# Patient Record
Sex: Female | Born: 1945 | Race: White | Hispanic: No | Smoking: Never smoker
Health system: Southern US, Community
[De-identification: ages and names within clinical notes are randomized; demographics above are authoritative.]

## PROBLEM LIST (undated history)

## (undated) DIAGNOSIS — G43909 Migraine, unspecified, not intractable, without status migrainosus: Secondary | ICD-10-CM

## (undated) DIAGNOSIS — N3946 Mixed incontinence: Secondary | ICD-10-CM

## (undated) DIAGNOSIS — M1990 Unspecified osteoarthritis, unspecified site: Secondary | ICD-10-CM

## (undated) DIAGNOSIS — E785 Hyperlipidemia, unspecified: Secondary | ICD-10-CM

## (undated) DIAGNOSIS — F259 Schizoaffective disorder, unspecified: Secondary | ICD-10-CM

## (undated) DIAGNOSIS — E079 Disorder of thyroid, unspecified: Secondary | ICD-10-CM

## (undated) DIAGNOSIS — F32A Depression, unspecified: Secondary | ICD-10-CM

## (undated) DIAGNOSIS — F419 Anxiety disorder, unspecified: Secondary | ICD-10-CM

## (undated) DIAGNOSIS — F329 Major depressive disorder, single episode, unspecified: Secondary | ICD-10-CM

## (undated) DIAGNOSIS — K219 Gastro-esophageal reflux disease without esophagitis: Secondary | ICD-10-CM

## (undated) DIAGNOSIS — C801 Malignant (primary) neoplasm, unspecified: Secondary | ICD-10-CM

## (undated) DIAGNOSIS — E119 Type 2 diabetes mellitus without complications: Secondary | ICD-10-CM

## (undated) DIAGNOSIS — G4733 Obstructive sleep apnea (adult) (pediatric): Secondary | ICD-10-CM

## (undated) DIAGNOSIS — R011 Cardiac murmur, unspecified: Secondary | ICD-10-CM

## (undated) DIAGNOSIS — F250 Schizoaffective disorder, bipolar type: Secondary | ICD-10-CM

## (undated) DIAGNOSIS — I519 Heart disease, unspecified: Secondary | ICD-10-CM

## (undated) DIAGNOSIS — I340 Nonrheumatic mitral (valve) insufficiency: Secondary | ICD-10-CM

## (undated) HISTORY — DX: Depression, unspecified: F32.A

## (undated) HISTORY — PX: BREAST CYST ASPIRATION: SHX578

## (undated) HISTORY — DX: Disorder of thyroid, unspecified: E07.9

## (undated) HISTORY — DX: Major depressive disorder, single episode, unspecified: F32.9

## (undated) HISTORY — DX: Heart disease, unspecified: I51.9

## (undated) HISTORY — DX: Mixed incontinence: N39.46

## (undated) HISTORY — PX: OTHER SURGICAL HISTORY: SHX169

## (undated) HISTORY — PX: CARPAL TUNNEL RELEASE: SHX101

## (undated) HISTORY — DX: Cardiac murmur, unspecified: R01.1

## (undated) HISTORY — DX: Nonrheumatic mitral (valve) insufficiency: I34.0

## (undated) HISTORY — DX: Obstructive sleep apnea (adult) (pediatric): G47.33

## (undated) HISTORY — DX: Gastro-esophageal reflux disease without esophagitis: K21.9

## (undated) HISTORY — DX: Hyperlipidemia, unspecified: E78.5

## (undated) HISTORY — DX: Unspecified osteoarthritis, unspecified site: M19.90

---

## 1998-07-30 HISTORY — PX: THYROIDECTOMY: SHX17

## 2009-01-06 ENCOUNTER — Ambulatory Visit: Payer: Self-pay | Admitting: Internal Medicine

## 2009-09-27 ENCOUNTER — Ambulatory Visit: Payer: Self-pay | Admitting: Family Medicine

## 2009-10-15 ENCOUNTER — Ambulatory Visit: Payer: Self-pay

## 2009-10-19 ENCOUNTER — Encounter: Admission: RE | Admit: 2009-10-19 | Discharge: 2009-10-19 | Payer: Self-pay | Admitting: Unknown Physician Specialty

## 2011-08-21 IMAGING — CR SACRUM AND COCCYX - 2+ VIEW
1 series · 4 of 4 positions shown · non-contrast
Comparison: none

REASON FOR EXAM: coccygel pain
COMMENTS:

[Series 1: view not recorded · 0.17mm/px · 4 of 4 slices shown]
[im 1/4]
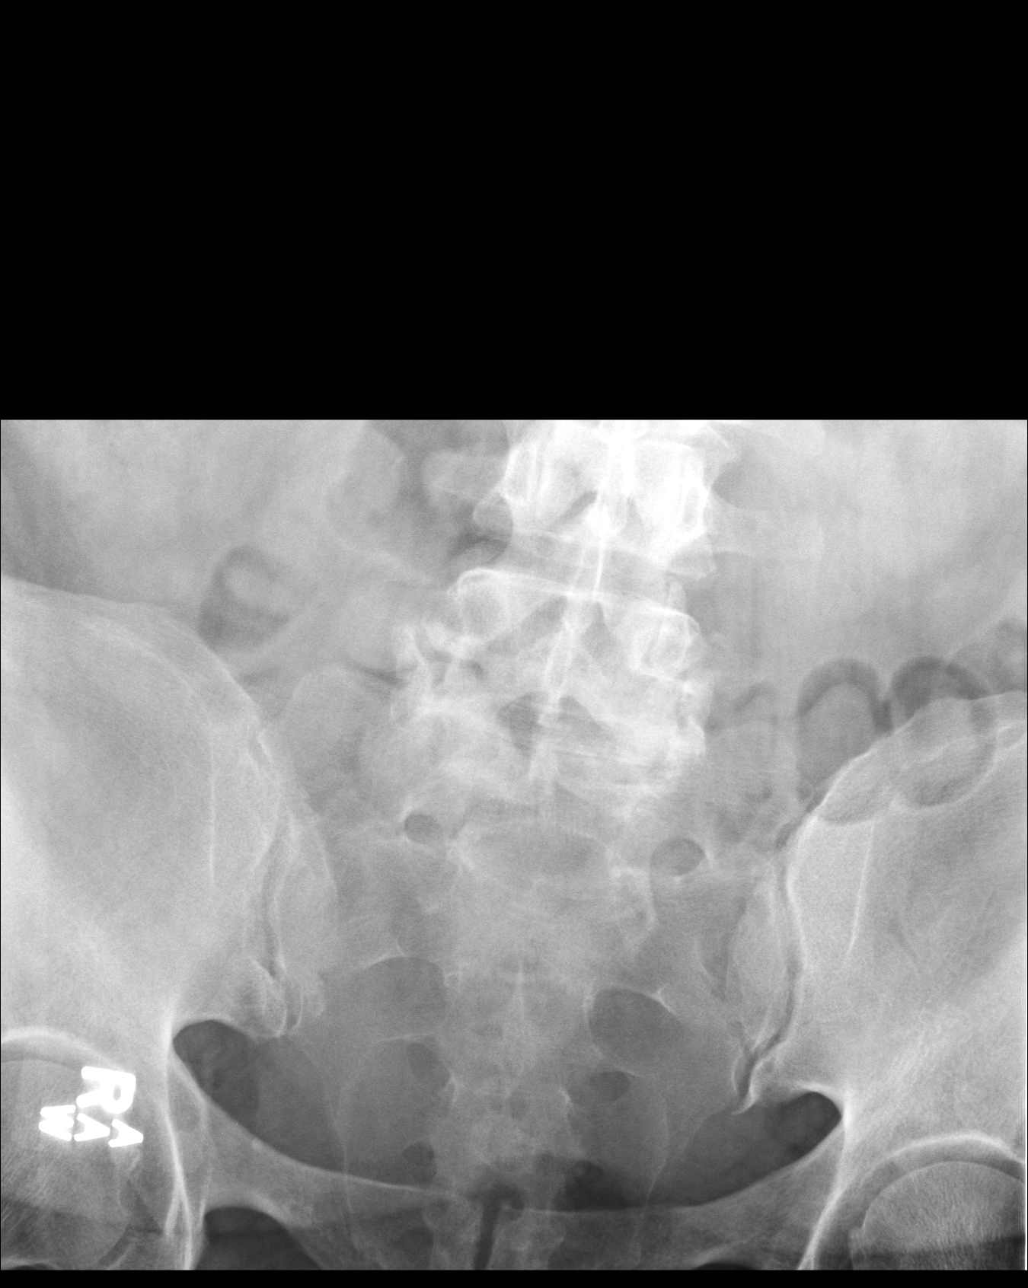
[im 2/4]
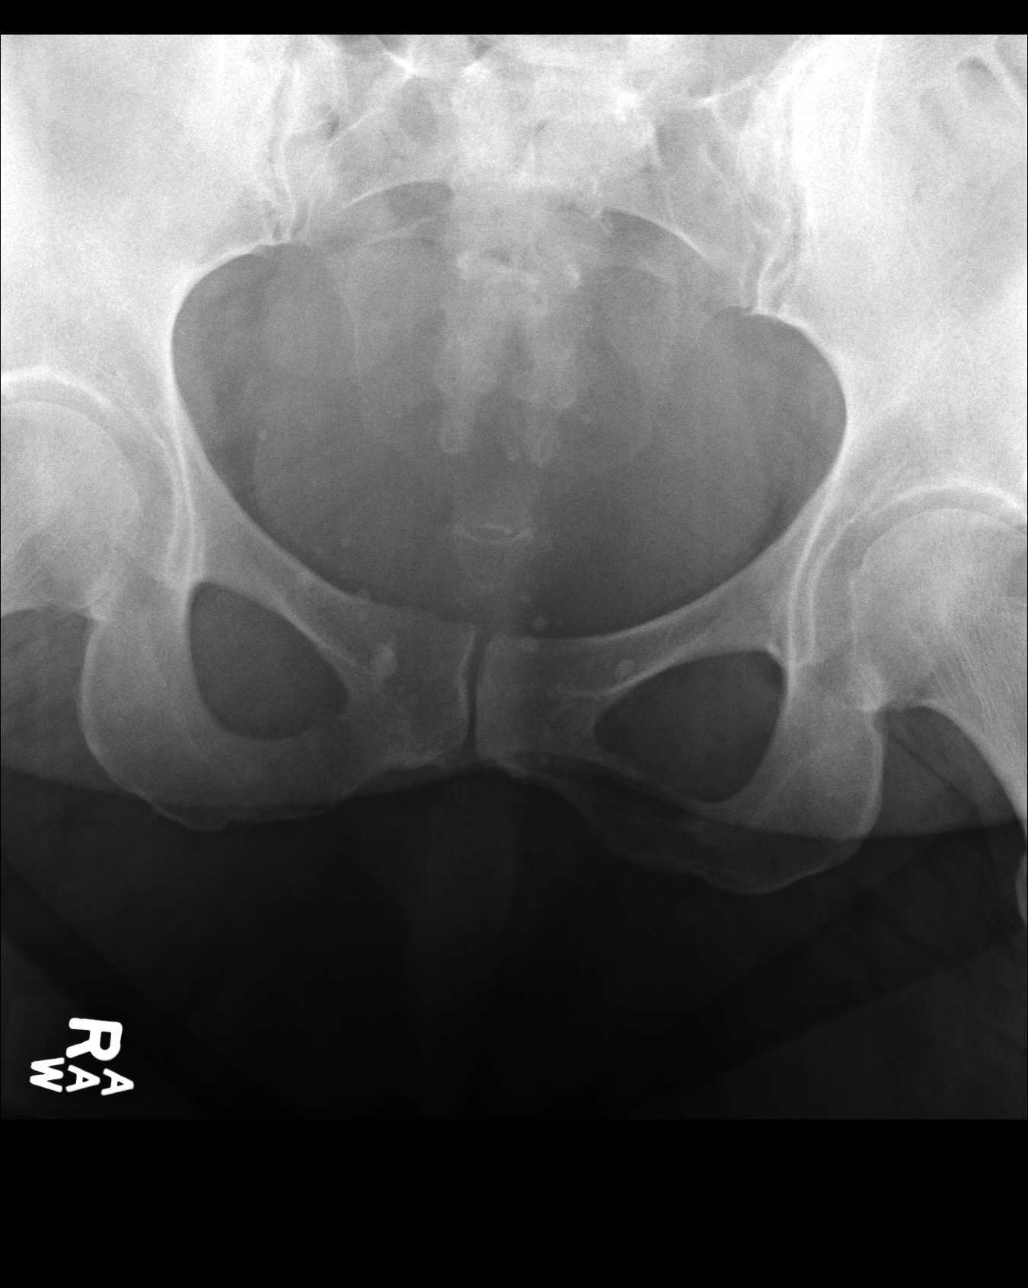
[im 3/4]
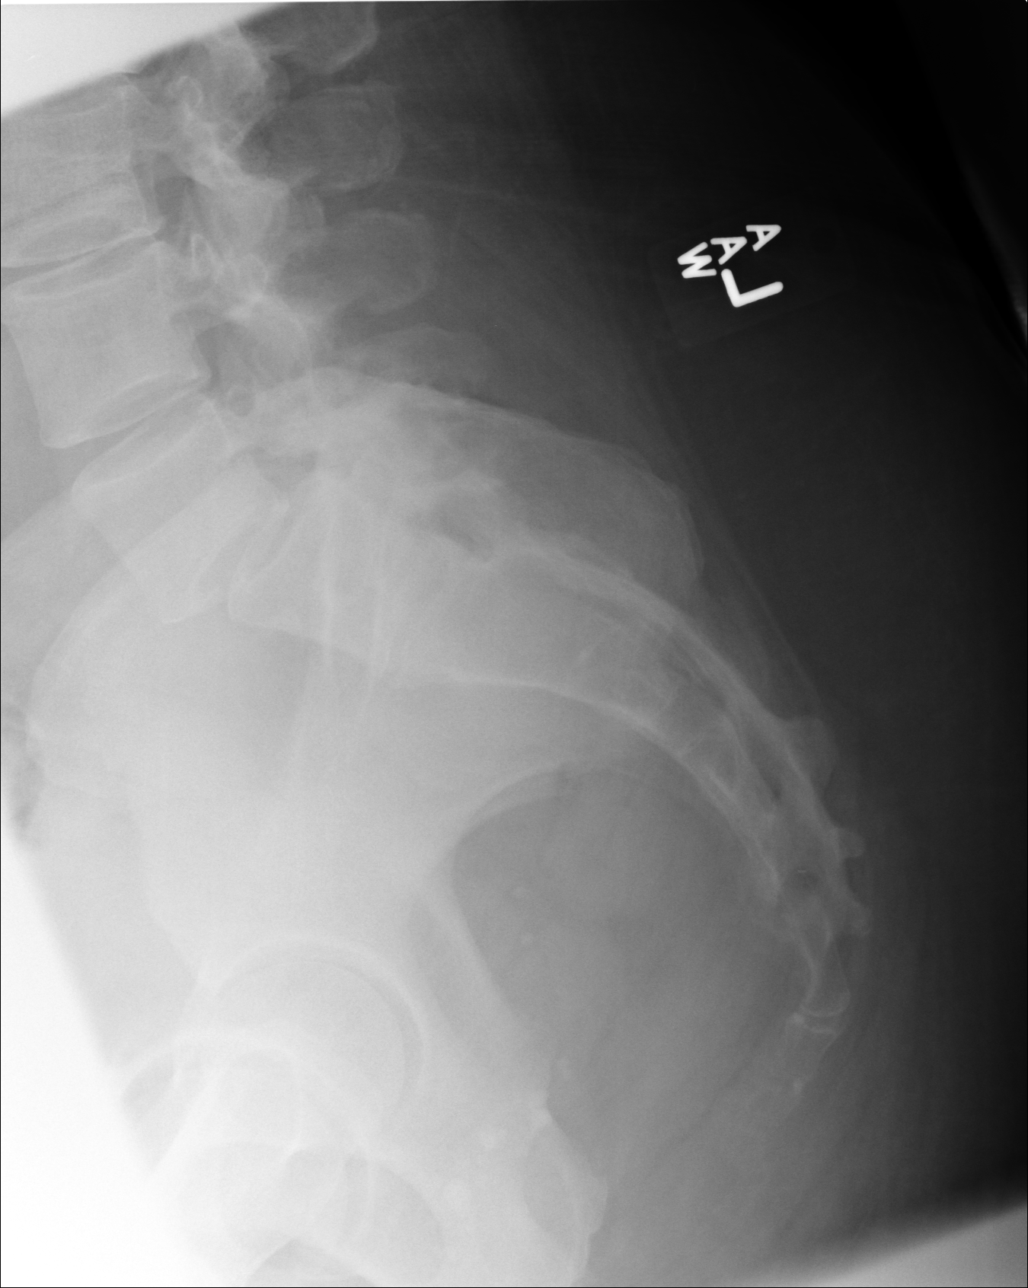
[im 4/4]
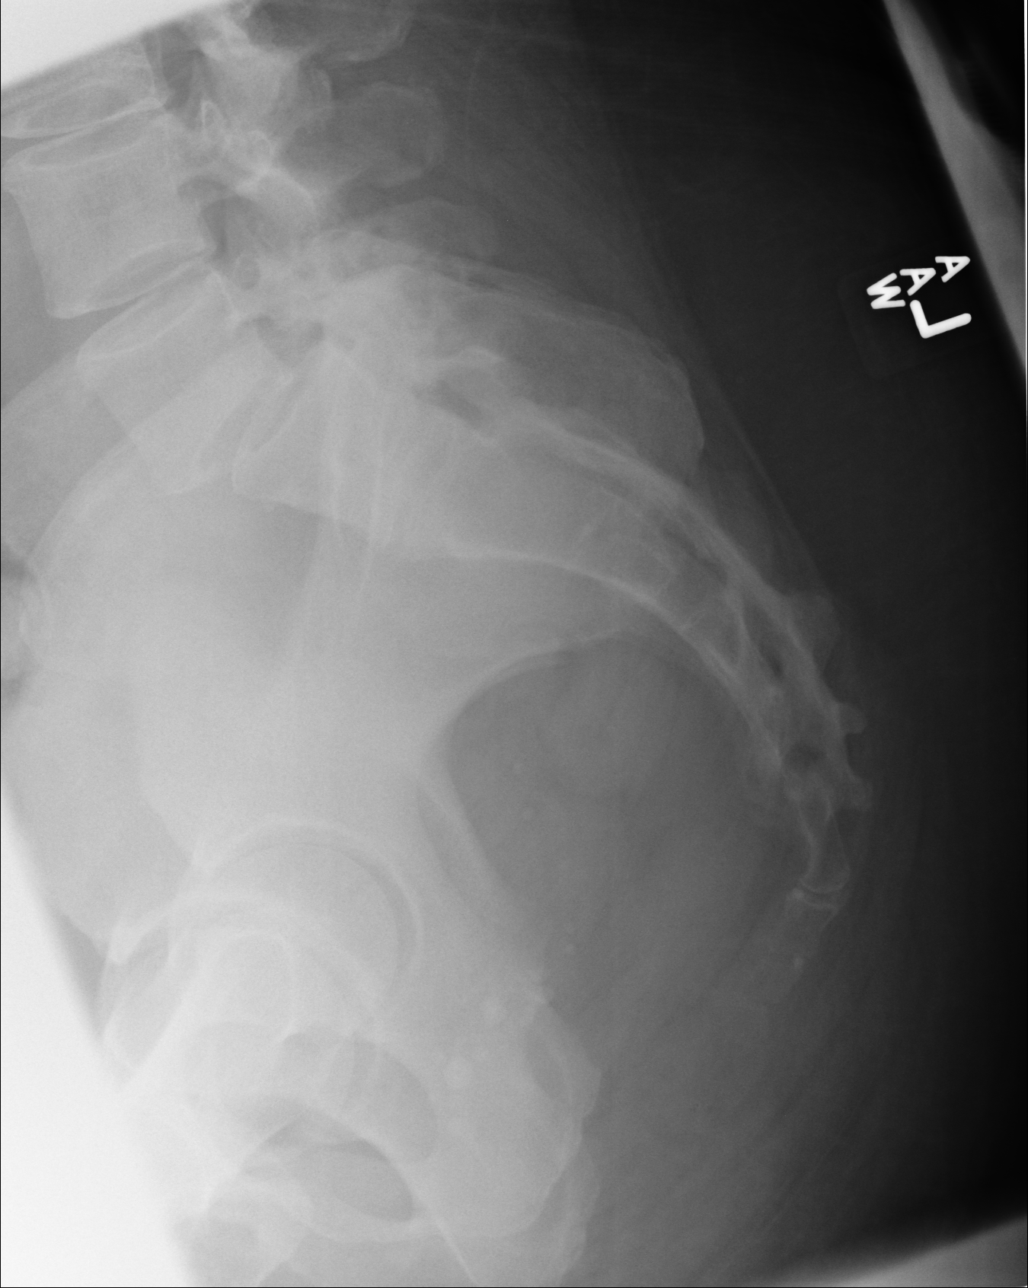

[4 of 4 positions shown; findings below may reference images not displayed]

PROCEDURE:     DXR - DXR SACRUM AND COCCYX  - September 27, 2009  [DATE]

RESULT:     Images of the sacrum and coccyx show multiple phleboliths over
the pelvic region. The sacral arches appear intact. A definite fracture is
not identified. There is anterolisthesis of L5 on S1. MRI or CT correlation
may be beneficial. Correlate clinically.
IMPRESSION: Probable spondylolysis with spondylolisthesis at L5-S1. CT or MRI
correlation may be helpful. Correlate clinically.

## 2011-08-30 ENCOUNTER — Ambulatory Visit: Payer: Self-pay | Admitting: Family Medicine

## 2012-09-02 ENCOUNTER — Ambulatory Visit: Payer: Self-pay | Admitting: Family Medicine

## 2012-09-15 ENCOUNTER — Ambulatory Visit: Payer: Self-pay | Admitting: Family Medicine

## 2012-12-08 DIAGNOSIS — E039 Hypothyroidism, unspecified: Secondary | ICD-10-CM | POA: Insufficient documentation

## 2012-12-08 DIAGNOSIS — F3342 Major depressive disorder, recurrent, in full remission: Secondary | ICD-10-CM | POA: Insufficient documentation

## 2012-12-08 DIAGNOSIS — R32 Unspecified urinary incontinence: Secondary | ICD-10-CM | POA: Insufficient documentation

## 2012-12-08 DIAGNOSIS — F329 Major depressive disorder, single episode, unspecified: Secondary | ICD-10-CM | POA: Insufficient documentation

## 2012-12-08 DIAGNOSIS — E7800 Pure hypercholesterolemia, unspecified: Secondary | ICD-10-CM | POA: Insufficient documentation

## 2012-12-08 DIAGNOSIS — K219 Gastro-esophageal reflux disease without esophagitis: Secondary | ICD-10-CM | POA: Insufficient documentation

## 2012-12-08 DIAGNOSIS — F32A Depression, unspecified: Secondary | ICD-10-CM | POA: Insufficient documentation

## 2012-12-08 DIAGNOSIS — G4730 Sleep apnea, unspecified: Secondary | ICD-10-CM | POA: Insufficient documentation

## 2012-12-08 DIAGNOSIS — F419 Anxiety disorder, unspecified: Secondary | ICD-10-CM | POA: Insufficient documentation

## 2012-12-08 DIAGNOSIS — R739 Hyperglycemia, unspecified: Secondary | ICD-10-CM | POA: Insufficient documentation

## 2013-07-01 DIAGNOSIS — I340 Nonrheumatic mitral (valve) insufficiency: Secondary | ICD-10-CM

## 2013-07-01 HISTORY — DX: Nonrheumatic mitral (valve) insufficiency: I34.0

## 2013-08-27 DIAGNOSIS — R059 Cough, unspecified: Secondary | ICD-10-CM | POA: Diagnosis not present

## 2013-08-27 DIAGNOSIS — L259 Unspecified contact dermatitis, unspecified cause: Secondary | ICD-10-CM | POA: Diagnosis not present

## 2013-08-27 DIAGNOSIS — G4720 Circadian rhythm sleep disorder, unspecified type: Secondary | ICD-10-CM | POA: Diagnosis not present

## 2013-08-27 DIAGNOSIS — R05 Cough: Secondary | ICD-10-CM | POA: Diagnosis not present

## 2013-08-27 DIAGNOSIS — G4710 Hypersomnia, unspecified: Secondary | ICD-10-CM | POA: Diagnosis not present

## 2013-08-27 DIAGNOSIS — G4730 Sleep apnea, unspecified: Secondary | ICD-10-CM | POA: Diagnosis not present

## 2013-09-01 DIAGNOSIS — Z1211 Encounter for screening for malignant neoplasm of colon: Secondary | ICD-10-CM | POA: Diagnosis not present

## 2013-09-09 DIAGNOSIS — G4710 Hypersomnia, unspecified: Secondary | ICD-10-CM | POA: Diagnosis not present

## 2013-09-14 DIAGNOSIS — R339 Retention of urine, unspecified: Secondary | ICD-10-CM | POA: Insufficient documentation

## 2013-09-14 DIAGNOSIS — N3946 Mixed incontinence: Secondary | ICD-10-CM | POA: Diagnosis not present

## 2013-09-30 DIAGNOSIS — M79609 Pain in unspecified limb: Secondary | ICD-10-CM | POA: Diagnosis not present

## 2013-09-30 DIAGNOSIS — Z124 Encounter for screening for malignant neoplasm of cervix: Secondary | ICD-10-CM | POA: Diagnosis not present

## 2013-09-30 DIAGNOSIS — N760 Acute vaginitis: Secondary | ICD-10-CM | POA: Diagnosis not present

## 2013-09-30 DIAGNOSIS — L02619 Cutaneous abscess of unspecified foot: Secondary | ICD-10-CM | POA: Diagnosis not present

## 2013-10-26 DIAGNOSIS — Z79899 Other long term (current) drug therapy: Secondary | ICD-10-CM | POA: Diagnosis not present

## 2013-10-26 DIAGNOSIS — Z5181 Encounter for therapeutic drug level monitoring: Secondary | ICD-10-CM | POA: Diagnosis not present

## 2013-10-26 DIAGNOSIS — F259 Schizoaffective disorder, unspecified: Secondary | ICD-10-CM | POA: Diagnosis not present

## 2013-10-27 DIAGNOSIS — Z79899 Other long term (current) drug therapy: Secondary | ICD-10-CM | POA: Diagnosis not present

## 2013-10-27 DIAGNOSIS — F259 Schizoaffective disorder, unspecified: Secondary | ICD-10-CM | POA: Diagnosis not present

## 2013-11-17 DIAGNOSIS — H25019 Cortical age-related cataract, unspecified eye: Secondary | ICD-10-CM | POA: Diagnosis not present

## 2013-12-07 DIAGNOSIS — G4710 Hypersomnia, unspecified: Secondary | ICD-10-CM | POA: Diagnosis not present

## 2013-12-07 DIAGNOSIS — G4720 Circadian rhythm sleep disorder, unspecified type: Secondary | ICD-10-CM | POA: Diagnosis not present

## 2013-12-07 DIAGNOSIS — G4730 Sleep apnea, unspecified: Secondary | ICD-10-CM | POA: Diagnosis not present

## 2013-12-09 DIAGNOSIS — G4730 Sleep apnea, unspecified: Secondary | ICD-10-CM | POA: Diagnosis not present

## 2013-12-09 DIAGNOSIS — G4710 Hypersomnia, unspecified: Secondary | ICD-10-CM | POA: Diagnosis not present

## 2013-12-17 DIAGNOSIS — Z1239 Encounter for other screening for malignant neoplasm of breast: Secondary | ICD-10-CM | POA: Diagnosis not present

## 2013-12-17 DIAGNOSIS — R7309 Other abnormal glucose: Secondary | ICD-10-CM | POA: Diagnosis not present

## 2013-12-17 DIAGNOSIS — E785 Hyperlipidemia, unspecified: Secondary | ICD-10-CM | POA: Diagnosis not present

## 2013-12-17 DIAGNOSIS — Z1159 Encounter for screening for other viral diseases: Secondary | ICD-10-CM | POA: Diagnosis not present

## 2013-12-17 DIAGNOSIS — E039 Hypothyroidism, unspecified: Secondary | ICD-10-CM | POA: Diagnosis not present

## 2014-01-06 DIAGNOSIS — D2370 Other benign neoplasm of skin of unspecified lower limb, including hip: Secondary | ICD-10-CM | POA: Diagnosis not present

## 2014-01-06 DIAGNOSIS — E1149 Type 2 diabetes mellitus with other diabetic neurological complication: Secondary | ICD-10-CM | POA: Diagnosis not present

## 2014-01-06 DIAGNOSIS — E1142 Type 2 diabetes mellitus with diabetic polyneuropathy: Secondary | ICD-10-CM | POA: Diagnosis not present

## 2014-02-10 ENCOUNTER — Ambulatory Visit: Payer: Self-pay | Admitting: Family Medicine

## 2014-02-10 DIAGNOSIS — Z1231 Encounter for screening mammogram for malignant neoplasm of breast: Secondary | ICD-10-CM | POA: Diagnosis not present

## 2014-03-15 DIAGNOSIS — F259 Schizoaffective disorder, unspecified: Secondary | ICD-10-CM | POA: Diagnosis not present

## 2014-04-19 DIAGNOSIS — E1149 Type 2 diabetes mellitus with other diabetic neurological complication: Secondary | ICD-10-CM | POA: Diagnosis not present

## 2014-04-19 DIAGNOSIS — L600 Ingrowing nail: Secondary | ICD-10-CM | POA: Diagnosis not present

## 2014-04-19 DIAGNOSIS — B351 Tinea unguium: Secondary | ICD-10-CM | POA: Diagnosis not present

## 2014-04-19 DIAGNOSIS — E1142 Type 2 diabetes mellitus with diabetic polyneuropathy: Secondary | ICD-10-CM | POA: Diagnosis not present

## 2014-04-28 DIAGNOSIS — N3946 Mixed incontinence: Secondary | ICD-10-CM | POA: Diagnosis not present

## 2014-04-28 DIAGNOSIS — R339 Retention of urine, unspecified: Secondary | ICD-10-CM | POA: Diagnosis not present

## 2014-06-07 DIAGNOSIS — J301 Allergic rhinitis due to pollen: Secondary | ICD-10-CM | POA: Diagnosis not present

## 2014-06-07 DIAGNOSIS — G4733 Obstructive sleep apnea (adult) (pediatric): Secondary | ICD-10-CM | POA: Diagnosis not present

## 2014-06-07 DIAGNOSIS — Z23 Encounter for immunization: Secondary | ICD-10-CM | POA: Diagnosis not present

## 2014-06-16 DIAGNOSIS — G4733 Obstructive sleep apnea (adult) (pediatric): Secondary | ICD-10-CM | POA: Diagnosis not present

## 2014-06-21 DIAGNOSIS — E780 Pure hypercholesterolemia: Secondary | ICD-10-CM | POA: Diagnosis not present

## 2014-06-21 DIAGNOSIS — Z780 Asymptomatic menopausal state: Secondary | ICD-10-CM | POA: Diagnosis not present

## 2014-06-21 DIAGNOSIS — Z23 Encounter for immunization: Secondary | ICD-10-CM | POA: Diagnosis not present

## 2014-06-21 DIAGNOSIS — E039 Hypothyroidism, unspecified: Secondary | ICD-10-CM | POA: Diagnosis not present

## 2014-06-21 DIAGNOSIS — E119 Type 2 diabetes mellitus without complications: Secondary | ICD-10-CM | POA: Diagnosis not present

## 2014-06-23 DIAGNOSIS — F250 Schizoaffective disorder, bipolar type: Secondary | ICD-10-CM | POA: Diagnosis not present

## 2014-07-19 DIAGNOSIS — B351 Tinea unguium: Secondary | ICD-10-CM | POA: Diagnosis not present

## 2014-07-19 DIAGNOSIS — E1142 Type 2 diabetes mellitus with diabetic polyneuropathy: Secondary | ICD-10-CM | POA: Diagnosis not present

## 2014-09-22 DIAGNOSIS — F250 Schizoaffective disorder, bipolar type: Secondary | ICD-10-CM | POA: Diagnosis not present

## 2014-10-11 DIAGNOSIS — Z79899 Other long term (current) drug therapy: Secondary | ICD-10-CM | POA: Diagnosis not present

## 2014-10-11 DIAGNOSIS — F250 Schizoaffective disorder, bipolar type: Secondary | ICD-10-CM | POA: Diagnosis not present

## 2014-11-06 ENCOUNTER — Emergency Department
Admit: 2014-11-06 | Disposition: A | Discharge status: Home / Self Care | Payer: Self-pay | Admitting: Emergency Medicine

## 2014-11-06 DIAGNOSIS — R0789 Other chest pain: Secondary | ICD-10-CM | POA: Diagnosis not present

## 2014-11-06 DIAGNOSIS — Z79899 Other long term (current) drug therapy: Secondary | ICD-10-CM | POA: Diagnosis not present

## 2014-11-06 DIAGNOSIS — R42 Dizziness and giddiness: Secondary | ICD-10-CM | POA: Diagnosis not present

## 2014-11-06 DIAGNOSIS — R079 Chest pain, unspecified: Secondary | ICD-10-CM | POA: Diagnosis not present

## 2014-11-06 LAB — BASIC METABOLIC PANEL
Anion Gap: 6 — ABNORMAL LOW (ref 7–16)
BUN: 11 mg/dL
CALCIUM: 9 mg/dL
CHLORIDE: 99 mmol/L — AB
CO2: 29 mmol/L
Creatinine: 1.01 mg/dL — ABNORMAL HIGH
EGFR (African American): >60
GFR CALC NON AF AMER: 58 — AB
GLUCOSE: 141 mg/dL — AB
Potassium: 3.9 mmol/L
SODIUM: 134 mmol/L — AB

## 2014-11-06 LAB — CBC
HCT: 33.9 % — AB (ref 35.0–47.0)
HGB: 11.5 g/dL — AB (ref 12.0–16.0)
MCH: 33.2 pg (ref 26.0–34.0)
MCHC: 34 g/dL (ref 32.0–36.0)
MCV: 98 fL (ref 80–100)
Platelet: 213 10*3/uL (ref 150–440)
RBC: 3.47 10*6/uL — ABNORMAL LOW (ref 3.80–5.20)
RDW: 13.2 % (ref 11.5–14.5)
WBC: 4.6 10*3/uL (ref 3.6–11.0)

## 2014-11-06 LAB — TROPONIN I: Troponin-I: <0.03 ng/mL

## 2014-12-06 DIAGNOSIS — B351 Tinea unguium: Secondary | ICD-10-CM | POA: Diagnosis not present

## 2014-12-06 DIAGNOSIS — E1142 Type 2 diabetes mellitus with diabetic polyneuropathy: Secondary | ICD-10-CM | POA: Diagnosis not present

## 2014-12-09 DIAGNOSIS — G4733 Obstructive sleep apnea (adult) (pediatric): Secondary | ICD-10-CM | POA: Diagnosis not present

## 2014-12-09 DIAGNOSIS — J301 Allergic rhinitis due to pollen: Secondary | ICD-10-CM | POA: Diagnosis not present

## 2014-12-09 DIAGNOSIS — E6609 Other obesity due to excess calories: Secondary | ICD-10-CM | POA: Diagnosis not present

## 2014-12-16 DIAGNOSIS — F250 Schizoaffective disorder, bipolar type: Secondary | ICD-10-CM | POA: Diagnosis not present

## 2014-12-20 ENCOUNTER — Other Ambulatory Visit: Payer: Self-pay | Admitting: Family Medicine

## 2014-12-20 DIAGNOSIS — Z780 Asymptomatic menopausal state: Secondary | ICD-10-CM

## 2014-12-20 DIAGNOSIS — G629 Polyneuropathy, unspecified: Secondary | ICD-10-CM | POA: Insufficient documentation

## 2014-12-20 DIAGNOSIS — E039 Hypothyroidism, unspecified: Secondary | ICD-10-CM | POA: Diagnosis not present

## 2014-12-20 DIAGNOSIS — R413 Other amnesia: Secondary | ICD-10-CM | POA: Insufficient documentation

## 2014-12-20 DIAGNOSIS — M6281 Muscle weakness (generalized): Secondary | ICD-10-CM | POA: Diagnosis not present

## 2014-12-20 DIAGNOSIS — R41840 Attention and concentration deficit: Secondary | ICD-10-CM | POA: Insufficient documentation

## 2014-12-20 DIAGNOSIS — E119 Type 2 diabetes mellitus without complications: Secondary | ICD-10-CM | POA: Diagnosis not present

## 2014-12-20 DIAGNOSIS — E780 Pure hypercholesterolemia: Secondary | ICD-10-CM | POA: Diagnosis not present

## 2014-12-20 DIAGNOSIS — Z0000 Encounter for general adult medical examination without abnormal findings: Secondary | ICD-10-CM | POA: Diagnosis not present

## 2014-12-20 DIAGNOSIS — D485 Neoplasm of uncertain behavior of skin: Secondary | ICD-10-CM | POA: Diagnosis not present

## 2014-12-20 DIAGNOSIS — Z1231 Encounter for screening mammogram for malignant neoplasm of breast: Secondary | ICD-10-CM

## 2014-12-29 DIAGNOSIS — G4733 Obstructive sleep apnea (adult) (pediatric): Secondary | ICD-10-CM | POA: Diagnosis not present

## 2015-01-04 ENCOUNTER — Ambulatory Visit: Payer: Medicare Other | Attending: Family Medicine | Admitting: Physical Therapy

## 2015-01-04 ENCOUNTER — Encounter: Payer: Self-pay | Admitting: Physical Therapy

## 2015-01-04 DIAGNOSIS — M6281 Muscle weakness (generalized): Secondary | ICD-10-CM | POA: Insufficient documentation

## 2015-01-04 DIAGNOSIS — R531 Weakness: Secondary | ICD-10-CM

## 2015-01-04 NOTE — Therapy (Addendum)
[place] MAIN [facility] SERVICES [address] [place], [place], [address] Phone: [phone]   Fax:  [phone]  Physical Therapy Evaluation  Patient Details  Name: Debbie Cruz MRN: [id number] Date of Birth: [birth year] Referring Provider:  Hortencia Pilar, MD  Encounter Date: 01/04/2015      PT End of Session - 03/26/2015 0929    Visit Number 1   Number of Visits 1   Date for PT Re-Evaluation 01/04/15   Authorization Type    PT Start Time 1000   PT Stop Time 1100   PT Time Calculation (min) 60 min   Equipment Utilized During Treatment Gait belt   Activity Tolerance Patient tolerated treatment well   Behavior During Therapy [facility] for tasks assessed/performed      Past Medical History  Diagnosis Date  . Anxiety   . Diabetes mellitus without complication   . Migraines   . Schizo-affective schizophrenia     Past Surgical History  Procedure Laterality Date  . Thyroidectomy      There were no vitals filed for this visit.  Visit Diagnosis:  Weakness - Plan: PT plan of care cert/re-cert  Patient is 69 years old and has numbness in BLE feet and fingers. She has pain down both legs posterior and bursitis in her knees. She has dizziness with turning and supine to sit.  She is independent with all mobility ,  has had 5 falls in the last year.  She is independent with transfers sit to stand with spc and 5 x sit to stand is 15.47 sec.  Strength is 3/5 R hip and 4/5 Knees and ankles.   Patient walks with a spc indoors and she uses a cane when she is outdoors. She is tying to get a wc for outdoor activities with her group home.       TUG 26.29  6 MW test is 385 feet with 1 rest period 10 mw is .49 m/sec  5 x sit to stand 15.47 sec                   PT Education - 2015-03-26 0929    Education provided Yes   Education Details plan of care, outcome measure results    Person(s) Educated Patient   Methods Explanation   Comprehension Verbalized understanding             PT Long Term Goals - Mar 26, 2015 1718    PT LONG TERM GOAL #1   Title Pt and caregivers will understand PT recommendation and appropriate/safe use for wheelchair and seating for home use   Status New               Plan - 2015/03/26 1726    Clinical Impression Statement Patient came to PT for wheelchair eval. Please see attached wheelchair eval letter of medical necessity and objective measures.   Pt will benefit from skilled therapeutic intervention in order to improve on the following deficits Difficulty walking   Rehab Potential Good   PT Frequency One time visit   PT Treatment/Interventions Wheelchair mobility training   Consulted and Agree with Plan of Care Patient          G-Codes - 03-26-15 0941    Functional Assessment Tool Used  outcome measures, clinical judgment   Functional Limitation Mobility: Walking and moving around   Mobility: Walking and Moving Around Current Status (Y8144) At least 60 but less than 80% impaired, limited or restricted  Mobility: Walking and Moving Around Goal Status [phone]) At least 60 but less than 80% impaired, limited or restricted   Mobility: Walking and Moving Around Discharge Status [phone]) At least 60 but less than 80% impaired, limited or restricted       Problem List There are no active problems to display for this patient.   [address]  PT, DPT  03/17/2015, 5:27 PM  [place] MAIN [facility] SERVICES [address] [place], [place], [address] Phone: [phone]   Fax:  [phone]

## 2015-01-05 DIAGNOSIS — E039 Hypothyroidism, unspecified: Secondary | ICD-10-CM | POA: Diagnosis not present

## 2015-01-06 ENCOUNTER — Ambulatory Visit: Payer: Self-pay

## 2015-01-10 DIAGNOSIS — G4739 Other sleep apnea: Secondary | ICD-10-CM | POA: Diagnosis not present

## 2015-01-20 ENCOUNTER — Emergency Department
Admission: EM | Admit: 2015-01-20 | Discharge: 2015-01-20 | Disposition: A | Discharge status: Home / Self Care | Payer: Medicare Other | Attending: Emergency Medicine | Admitting: Emergency Medicine

## 2015-01-20 ENCOUNTER — Emergency Department: Payer: Medicare Other

## 2015-01-20 ENCOUNTER — Encounter: Payer: Self-pay | Admitting: Emergency Medicine

## 2015-01-20 DIAGNOSIS — Y998 Other external cause status: Secondary | ICD-10-CM | POA: Insufficient documentation

## 2015-01-20 DIAGNOSIS — S0990XA Unspecified injury of head, initial encounter: Secondary | ICD-10-CM | POA: Diagnosis not present

## 2015-01-20 DIAGNOSIS — Y9289 Other specified places as the place of occurrence of the external cause: Secondary | ICD-10-CM | POA: Insufficient documentation

## 2015-01-20 DIAGNOSIS — R509 Fever, unspecified: Secondary | ICD-10-CM | POA: Diagnosis not present

## 2015-01-20 DIAGNOSIS — S0093XA Contusion of unspecified part of head, initial encounter: Secondary | ICD-10-CM | POA: Insufficient documentation

## 2015-01-20 DIAGNOSIS — Z79899 Other long term (current) drug therapy: Secondary | ICD-10-CM | POA: Diagnosis not present

## 2015-01-20 DIAGNOSIS — F209 Schizophrenia, unspecified: Secondary | ICD-10-CM | POA: Diagnosis not present

## 2015-01-20 DIAGNOSIS — Y9389 Activity, other specified: Secondary | ICD-10-CM | POA: Insufficient documentation

## 2015-01-20 DIAGNOSIS — F419 Anxiety disorder, unspecified: Secondary | ICD-10-CM | POA: Diagnosis not present

## 2015-01-20 DIAGNOSIS — I10 Essential (primary) hypertension: Secondary | ICD-10-CM | POA: Diagnosis not present

## 2015-01-20 DIAGNOSIS — E119 Type 2 diabetes mellitus without complications: Secondary | ICD-10-CM | POA: Insufficient documentation

## 2015-01-20 DIAGNOSIS — W01198A Fall on same level from slipping, tripping and stumbling with subsequent striking against other object, initial encounter: Secondary | ICD-10-CM | POA: Diagnosis not present

## 2015-01-20 DIAGNOSIS — R51 Headache: Secondary | ICD-10-CM | POA: Diagnosis not present

## 2015-01-20 HISTORY — DX: Migraine, unspecified, not intractable, without status migrainosus: G43.909

## 2015-01-20 HISTORY — DX: Type 2 diabetes mellitus without complications: E11.9

## 2015-01-20 HISTORY — DX: Anxiety disorder, unspecified: F41.9

## 2015-01-20 HISTORY — DX: Schizoaffective disorder, bipolar type: F25.0

## 2015-01-20 HISTORY — DX: Schizoaffective disorder, unspecified: F25.9

## 2015-01-20 MED ORDER — OXYCODONE-ACETAMINOPHEN 5-325 MG PO TABS: 1.0000 | ORAL_TABLET | Freq: Once | ORAL | Status: DC

## 2015-01-20 MED ORDER — BUTALBITAL-APAP-CAFFEINE 50-325-40 MG PO TABS: 1.0000 | ORAL_TABLET | Freq: Four times a day (QID) | ORAL | Status: DC | PRN

## 2015-01-20 NOTE — ED Provider Notes (Signed)
[facility] Emergency Department Provider Note  ____________________________________________  Time seen: Approximately 5:57 PM  I have reviewed the triage vital signs and the nursing notes.   HISTORY  Chief Complaint Fall    HPI Debbie Cruz is a 69 y.o. female patient complaining of headache for 3 hours now secondary to a fall which occurred earlier today. States that she tripped over door still fell and struck her head on corner of a table. Patient denies any loss of consciousness but stated this increasing headache. Patient denies any vision changes. Patient has mild nausea but has not been any vomiting. Patient rated the pain as a 5/10. Patient also states she sensitive to light. Patient arrived via EMS.   Past Medical History  Diagnosis Date  . Anxiety   . Diabetes mellitus without complication   . Migraines   . Schizo-affective schizophrenia     There are no active problems to display for this patient.   Past Surgical History  Procedure Laterality Date  . Thyroidectomy      Current Outpatient Rx  Name  Route  Sig  Dispense  Refill  . acetaminophen (TYLENOL) 500 MG chewable tablet   Oral   Chew 500 mg by mouth every 6 (six) hours as needed for pain.         Marland Kitchen buPROPion (WELLBUTRIN) 100 MG tablet   Oral   Take 100 mg by mouth 2 (two) times daily.         . butalbital-acetaminophen-caffeine (FIORICET) 50-325-40 MG per tablet   Oral   Take 1-2 tablets by mouth every 6 (six) hours as needed for headache.   20 tablet   0   . Calcium Carbonate-Vitamin D (CALCIUM-VITAMIN D) 500-200 MG-UNIT per tablet   Oral   Take 1 tablet by mouth daily.         Marland Kitchen levothyroxine (SYNTHROID, LEVOTHROID) 125 MCG tablet   Oral   Take 125 mcg by mouth daily before breakfast.         . risperiDONE (RISPERDAL) 0.5 MG tablet   Oral   Take 0.5 mg by mouth at bedtime.         . sertraline (ZOLOFT) 100 MG tablet   Oral   Take 100 mg by mouth  daily.           Allergies Tetracyclines & related; Sulfa antibiotics; and Ultram  History reviewed. No pertinent family history.  Social History History  Substance Use Topics  . Smoking status: Never Smoker   . Smokeless tobacco: Not on file  . Alcohol Use: No    Review of Systems Constitutional: No fever/chills Eyes: No visual changes. ENT: No sore throat. Cardiovascular: Denies chest pain. Respiratory: Denies shortness of breath. Gastrointestinal: No abdominal pain.  Nausea, no vomiting.  No diarrhea.  No constipation. Genitourinary: Negative for dysuria. Musculoskeletal: Negative for back pain. Skin: Negative for rash. Neurological: Positive for headaches, denies focal weakness or numbness. Psychiatric:Anxiety schizophrenia Endocrine:Diabetes and hypertension Hematological/Lymphatic: Allergic/Immunilogical: See medication list.  10-point ROS otherwise negative.  ____________________________________________   PHYSICAL EXAM:  VITAL SIGNS: ED Triage Vitals  Enc Vitals Group     BP 01/20/15 1726 153/60 mmHg     Pulse Rate 01/20/15 1726 61     Resp 01/20/15 1726 20     Temp 01/20/15 1726 98.5 F (36.9 C)     Temp Source 01/20/15 1726 Oral     SpO2 01/20/15 1726 100 %     Weight 01/20/15 1726 230  lb (104.327 kg)     Height 01/20/15 1726 5' (1.524 m)     Head Cir --      Peak Flow --      Pain Score 01/20/15 1709 5     Pain Loc --      Pain Edu? --      Excl. in [place]? --     Constitutional: Alert and oriented. Moderate distress. Eyes: Conjunctivae are normal. PERRL. EOMI. Head: Atraumatic. Nose: No congestion/rhinnorhea. Mouth/Throat: Mucous membranes are moist.  Oropharynx non-erythematous. Neck: No stridor.  No cervical spine tenderness to palpation. Hematological/Lymphatic/Immunilogical: No cervical lymphadenopathy. Cardiovascular: Normal rate, regular rhythm. Grossly normal heart sounds.  Good peripheral circulation. Elevated BP Respiratory:  Normal respiratory effort.  No retractions. Lungs CTAB. Gastrointestinal: Soft and nontender. No distention. No abdominal bruits. No CVA tenderness. Musculoskeletal: No lower extremity tenderness nor edema.  No joint effusions. Neurologic:  Normal speech and language. No gross focal neurologic deficits are appreciated. Speech is normal. No gait instability. Skin:  Skin is warm, dry and intact. No rash noted. Psychiatric: Mood and affect are normal. Speech and behavior are normal.  ____________________________________________   LABS (all labs ordered are listed, but only abnormal results are displayed)  Labs Reviewed - No data to display ____________________________________________  EKG   ____________________________________________  RADIOLOGY  CT scan of the head was unremarkable. ____________________________________________   PROCEDURES  Procedure(s) performed: None  Critical Care performed: No  ____________________________________________   INITIAL IMPRESSION / ASSESSMENT AND PLAN / ED COURSE  Pertinent labs & imaging results that were available during my care of the patient were reviewed by me and considered in my medical decision making (see chart for details).  Headache secondary to head contusion. Discussed sequela of injury with patient. Advised to take Esgic plus for headache and follow up with Family Doctor. Return to ER if condition worsen. ____________________________________________   FINAL CLINICAL IMPRESSION(S) / ED DIAGNOSES  Final diagnoses:  Head contusion, initial encounter      Sable Feil, PA-C 01/20/15 1952  Orbie Pyo, MD 01/20/15 [phone]

## 2015-01-20 NOTE — ED Notes (Signed)
Pt trip and fall earlier today and hit head, no LOC. Headache since. Pt alert and oriented X4, active, cooperative, pt in NAD. RR even and unlabored, color WNL.

## 2015-01-20 NOTE — ED Notes (Signed)
Tripped and fell today pta.  Reports hitting head, no LOC. PERRL.

## 2015-01-21 DIAGNOSIS — R296 Repeated falls: Secondary | ICD-10-CM | POA: Diagnosis not present

## 2015-01-21 DIAGNOSIS — R413 Other amnesia: Secondary | ICD-10-CM | POA: Diagnosis not present

## 2015-01-21 DIAGNOSIS — E871 Hypo-osmolality and hyponatremia: Secondary | ICD-10-CM | POA: Diagnosis not present

## 2015-01-21 DIAGNOSIS — G44311 Acute post-traumatic headache, intractable: Secondary | ICD-10-CM | POA: Diagnosis not present

## 2015-01-21 DIAGNOSIS — R110 Nausea: Secondary | ICD-10-CM | POA: Diagnosis not present

## 2015-01-22 LAB — GLUCOSE, CAPILLARY: Glucose-Capillary: 85 mg/dL (ref 65–99)

## 2015-02-01 DIAGNOSIS — R296 Repeated falls: Secondary | ICD-10-CM | POA: Insufficient documentation

## 2015-02-01 DIAGNOSIS — G4733 Obstructive sleep apnea (adult) (pediatric): Secondary | ICD-10-CM | POA: Diagnosis not present

## 2015-02-01 DIAGNOSIS — R413 Other amnesia: Secondary | ICD-10-CM | POA: Diagnosis not present

## 2015-02-01 DIAGNOSIS — R2689 Other abnormalities of gait and mobility: Secondary | ICD-10-CM | POA: Diagnosis not present

## 2015-02-01 DIAGNOSIS — G629 Polyneuropathy, unspecified: Secondary | ICD-10-CM | POA: Diagnosis not present

## 2015-02-02 DIAGNOSIS — G4733 Obstructive sleep apnea (adult) (pediatric): Secondary | ICD-10-CM | POA: Diagnosis not present

## 2015-02-02 DIAGNOSIS — R413 Other amnesia: Secondary | ICD-10-CM | POA: Diagnosis not present

## 2015-02-02 DIAGNOSIS — R262 Difficulty in walking, not elsewhere classified: Secondary | ICD-10-CM | POA: Insufficient documentation

## 2015-02-02 HISTORY — DX: Obstructive sleep apnea (adult) (pediatric): G47.33

## 2015-02-14 ENCOUNTER — Ambulatory Visit: Payer: Self-pay

## 2015-02-14 DIAGNOSIS — L578 Other skin changes due to chronic exposure to nonionizing radiation: Secondary | ICD-10-CM | POA: Diagnosis not present

## 2015-02-14 DIAGNOSIS — D485 Neoplasm of uncertain behavior of skin: Secondary | ICD-10-CM | POA: Diagnosis not present

## 2015-02-14 DIAGNOSIS — C44311 Basal cell carcinoma of skin of nose: Secondary | ICD-10-CM | POA: Diagnosis not present

## 2015-02-15 DIAGNOSIS — R0602 Shortness of breath: Secondary | ICD-10-CM | POA: Diagnosis not present

## 2015-02-15 DIAGNOSIS — G4733 Obstructive sleep apnea (adult) (pediatric): Secondary | ICD-10-CM | POA: Diagnosis not present

## 2015-02-15 DIAGNOSIS — J301 Allergic rhinitis due to pollen: Secondary | ICD-10-CM | POA: Diagnosis not present

## 2015-02-16 ENCOUNTER — Ambulatory Visit: Payer: Medicare Other | Attending: Neurology

## 2015-02-16 DIAGNOSIS — R262 Difficulty in walking, not elsewhere classified: Secondary | ICD-10-CM | POA: Insufficient documentation

## 2015-02-16 DIAGNOSIS — R2681 Unsteadiness on feet: Secondary | ICD-10-CM | POA: Diagnosis not present

## 2015-02-16 DIAGNOSIS — R29898 Other symptoms and signs involving the musculoskeletal system: Secondary | ICD-10-CM

## 2015-02-16 NOTE — Therapy (Addendum)
[place] MAIN [facility] SERVICES [address] [place], [place], [address] Phone: [phone]   Fax:  [phone]  Physical Therapy Evaluation  Patient Details  Name: Debbie Cruz MRN: [id number] Date of Birth: [birth year] Referring Provider:  Anabel Bene, MD  Encounter Date: 02/16/2015      PT End of Session - 02/16/15 1529    Visit Number 1   Number of Visits 9   Date for PT Re-Evaluation 03/09/15   Authorization Type 1/10 g codes    PT Start Time 1000   PT Stop Time 1045   PT Time Calculation (min) 45 min   Equipment Utilized During Treatment Gait belt   Activity Tolerance Patient tolerated treatment well   Behavior During Therapy [facility] for tasks assessed/performed      Past Medical History  Diagnosis Date  . Anxiety   . Diabetes mellitus without complication   . Migraines   . Schizo-affective schizophrenia     Past Surgical History  Procedure Laterality Date  . Thyroidectomy      There were no vitals filed for this visit.  Visit Diagnosis:  Unsteadiness on feet  Weakness of both legs      Subjective Assessment - 02/16/15 1522    Subjective pt reports she received her wheelchair yesterday and is seeking PT servies to improve her mobility and balance. pt relates ~ 1 year history of falling and in the last 6 months has fallend 2-3 times.  pt reports she is not afraid of falling but would like to reduce the incidence of falling.  pt reports using single point for safity and to improve stability.    Patient Stated Goals improve balalance   Multiple Pain Sites No           pt arrived ~15 minutes late  LE dermatomes: intact but less on L LE myotomes: WNL  LE MMT: Right hip flexion: 4-/5 Left hip flexion: 4-/5 Right/left knee extension: 5/5 Right/left knee flexion: 5/5 Right/left ankle dorsiflexion: 4+/5 Right/left ankle eversion: 4+/5 Right left ankle inversion: 4+/5    Right/left knee jerk:  absent Ankle jerk: absent  5x sit-to-stand:25.95 seconds 10 meter gait speed with single point cane: 0.31m/s TUG: 17.15 seconds Berg: 44/56                       PT Education - 02/16/15 1549    Education provided Yes   Education Details results of outcome measures, plan of care   Person(s) Educated Patient   Methods Explanation   Comprehension Verbalized understanding             PT Long Term Goals - 02/16/15 1549    PT LONG TERM GOAL #1   Title pt will improve BERG score by at least 4 points to decrease risk of falls   Baseline 44/56   Time 4   Period Weeks   Status New   PT LONG TERM GOAL #2   Title pt will improve 5x sit to stand to by at least 2.3 seconds to demonstrate increaeed functional LE strength decreaes in falls.    Baseline 25.95 seconds   Time 4   Period Weeks   Status New   PT LONG TERM GOAL #3   Title pt will ambulate with gait speed at least 0.4 m/s in order ambulate limited community    Baseline 0.38 m/s (househould ambulation)   Time 4   Period Weeks   Status New  Plan - 02/16/15 1531    Clinical Impression Statement pt is a 69 year old female with ~1 year history of falls and unsteadiness.  pt presents with decreased hip flexion strength (4-/5) and WNL knee and ankle strength.  pt's results in the 5x sit-to-stand (25.95 seoncds), 71m gate speed (0.38 m/s) , TUG (17.15 sec) and Berg (44/56/) indicate she is at high risk of falls.  pt ambulates with decreased cadence, single point cane, and wide gait pattern.  pt would benefit from skilled PT servies to improve LE strength, balance, mobility, gait, decrease risk of fall .   Pt will benefit from skilled therapeutic intervention in order to improve on the following deficits Abnormal gait;Difficulty walking;Decreased endurance;Decreased activity tolerance;Decreased balance;Decreased strength;Decreased mobility   Rehab Potential Good   PT Frequency 2x / week   PT  Duration 4 weeks   PT Treatment/Interventions Functional mobility training;Patient/family education;Aquatic Therapy;Therapeutic activities;Therapeutic exercise;Balance training;Gait training;Neuromuscular re-education;Manual techniques   PT Next Visit Plan 6 min walk test, HEP          G-Codes - 03-04-15 1330    Functional Assessment Tool Used 42mwalk/berg/5xsittostand   Functional Limitation Mobility: Walking and moving around   Mobility: Walking and Moving Around Current Status (H6808) At least 20 percent but less than 40 percent impaired, limited or restricted   Mobility: Walking and Moving Around Goal Status (U1103) At least 1 percent but less than 20 percent impaired, limited or restricted       Problem List There are no active problems to display for this patient.  Renford Dills, SPT Tortorici,Ashley 03-04-15, 1:37 PM  This entire session was performed under direct supervision and direction of a licensed therapist/therapist assistant . I have personally read, edited and approve of the note as written. Gorden Harms. Tortorici, PT, DPT [phone]   [place] MAIN [facility] SERVICES [address] [place], [place], [address] Phone: [phone]   Fax:  [phone]

## 2015-02-22 ENCOUNTER — Ambulatory Visit: Payer: Medicare Other

## 2015-02-22 DIAGNOSIS — R262 Difficulty in walking, not elsewhere classified: Secondary | ICD-10-CM | POA: Diagnosis not present

## 2015-02-22 DIAGNOSIS — R29898 Other symptoms and signs involving the musculoskeletal system: Secondary | ICD-10-CM

## 2015-02-22 DIAGNOSIS — R2681 Unsteadiness on feet: Secondary | ICD-10-CM

## 2015-02-22 NOTE — Therapy (Signed)
[place] MAIN [facility] SERVICES [address] [place], [place], [address] Phone: [phone]   Fax:  [phone]  Physical Therapy Treatment  Patient Details  Name: Debbie Cruz MRN: [id number] Date of Birth: 1945/09/25 Referring Provider:  Anabel Bene, MD  Encounter Date: 02/22/2015      PT End of Session - 02/22/15 1245    Visit Number 2   Number of Visits 9   Date for PT Re-Evaluation 03/09/15   Authorization Type 2/10 g codes    PT Start Time 0949   PT Stop Time 1039   PT Time Calculation (min) 50 min   Equipment Utilized During Treatment Gait belt   Activity Tolerance Patient tolerated treatment well   Behavior During Therapy [facility] for tasks assessed/performed      Past Medical History  Diagnosis Date  . Anxiety   . Diabetes mellitus without complication   . Migraines   . Schizo-affective schizophrenia     Past Surgical History  Procedure Laterality Date  . Thyroidectomy      There were no vitals filed for this visit.  Visit Diagnosis:  Unsteadiness on feet  Weakness of both legs      Subjective Assessment - 02/22/15 1243    Subjective pt relates she is doing well and will be having surgery on her nose ealry August to remove cancerous mole/skin tissue.     Patient Stated Goals improve balalance   Currently in Pain? No/denies       therex: Nustep: x5 min (no charge) Sit to stand x10 Bilateral hip flexion/abduction/extension with yellow band x10 each Mini squats on airex x10 Clamshells x10  Bilateral SLR X10 Bridges x10 Lateral step-ups x10 each   Neuro re-ed Forward/backwards/sidestepping in //bars x4 lengths, pt required verbal cues to perform exercises with minimal use of UEs Bilateral forward step-back to standing/backwards step-back to starting position/side step and back to starting position to promote LE use in case of LOB to decrease falls.   Pt required sitting breaks between 1-2 exercises  for ~ 30 sec-1 min                           PT Education - 02/22/15 1244    Education provided Yes   Education Details resuming HEP to improve functional activity tolerance, strength and decresae knee pain    Person(s) Educated Patient   Methods Explanation   Comprehension Verbalized understanding             PT Long Term Goals - 02/16/15 1549    PT LONG TERM GOAL #1   Title pt will improve BERG score by at least 4 points to decrease risk of falls   Baseline 44/56   Time 4   Period Weeks   Status New   PT LONG TERM GOAL #2   Title pt will improve 5x sit to stand to by at least 2.3 seconds to demonstrate increaeed functional LE strength decreaes in falls.    Baseline 25.95 seconds   Time 4   Period Weeks   Status New   PT LONG TERM GOAL #3   Title pt will ambulate with gait speed at least 0.4 m/s in order ambulate limited community    Baseline 0.38 m/s (househould ambulation)   Time 4   Period Weeks   Status New               Plan - 02/22/15 1246  Clinical Impression Statement pt was able to complete session with min incresae in knee pain and supervision assist.  pt demonstrates decreased functional activity due to taking breaks between exercises and increased breathing rate.  pt deomonstrates difficulty clearing left foot during some activities such as side stepping to the rigth.  pt would benefit from skilled PT servies to improve strength, functional activity tolerance, gait and decrease pain.    Pt will benefit from skilled therapeutic intervention in order to improve on the following deficits Abnormal gait;Difficulty walking;Decreased endurance;Decreased activity tolerance;Decreased balance;Decreased strength;Decreased mobility   Rehab Potential Good   PT Frequency 2x / week   PT Duration 4 weeks   PT Treatment/Interventions Functional mobility training;Patient/family education;Aquatic Therapy;Therapeutic activities;Therapeutic  exercise;Balance training;Gait training;Neuromuscular re-education;Manual techniques   PT Next Visit Plan 6 min walk test        Problem List There are no active problems to display for this patient.  Renford Dills, SPT This entire session was performed under direct supervision and direction of a licensed Chiropractor . I have personally read, edited and approve of the note as written. Gorden Harms. Tortorici, PT, DPT [phone]  Tortorici,Ashley 02/22/2015, 2:38 PM  [place] MAIN [facility] SERVICES [address] [place], [place], [address] Phone: [phone]   Fax:  [phone]

## 2015-02-22 NOTE — Patient Instructions (Signed)
[email or web address]  Clamshells 2x10 SLR 2x10 Bridges 2x10

## 2015-02-23 DIAGNOSIS — G4733 Obstructive sleep apnea (adult) (pediatric): Secondary | ICD-10-CM | POA: Diagnosis not present

## 2015-02-24 ENCOUNTER — Ambulatory Visit: Payer: Medicare Other

## 2015-02-24 DIAGNOSIS — R2681 Unsteadiness on feet: Secondary | ICD-10-CM

## 2015-02-24 DIAGNOSIS — R531 Weakness: Secondary | ICD-10-CM

## 2015-02-24 DIAGNOSIS — R29898 Other symptoms and signs involving the musculoskeletal system: Secondary | ICD-10-CM

## 2015-02-24 DIAGNOSIS — R262 Difficulty in walking, not elsewhere classified: Secondary | ICD-10-CM | POA: Diagnosis not present

## 2015-02-24 NOTE — Patient Instructions (Signed)
Toe taps on step- holding 1 railing 2x10 Walking program

## 2015-02-24 NOTE — Therapy (Signed)
[place] MAIN [facility] SERVICES [address] [place], [place], [address] Phone: [phone]   Fax:  [phone]  Physical Therapy Treatment  Patient Details  Name: Debbie Cruz MRN: [id number] Date of Birth: [birth year] Referring Provider:  Anabel Bene, MD  Encounter Date: 02/24/2015      PT End of Session - 02/24/15 1047    Visit Number 3   Number of Visits 9   Date for PT Re-Evaluation 03/09/15   Authorization Type 3/10 g code   PT Start Time 0945   PT Stop Time 1030   PT Time Calculation (min) 45 min   Equipment Utilized During Treatment Gait belt   Activity Tolerance Patient tolerated treatment well   Behavior During Therapy [facility] for tasks assessed/performed      Past Medical History  Diagnosis Date  . Anxiety   . Diabetes mellitus without complication   . Migraines   . Schizo-affective schizophrenia     Past Surgical History  Procedure Laterality Date  . Thyroidectomy      There were no vitals filed for this visit.  Visit Diagnosis:  Unsteadiness on feet  Weakness of both legs  Weakness      Subjective Assessment - 02/24/15 1045    Subjective pt reports she was a little sore after last session in her leg muscles   Patient Stated Goals improve balalance        Therex: Nustep LE only L 2- cues for SPM >50 x 4 min Sitting adductor squeeze 3x10 Toe taps holding 1 railing 3x10 cues for foot clearance  Fwd step up (6in) 1 railing x 10 each leg  NMR: Toe taps on step- no UE 3x10  Fwd step over 1/2 roll x 10 each leg Sidesteps with L/R head turns x 3 laps in // bars no UE Tandem walking fwd/retro in // bars x 3 laps +98min A for balance correction Pt requires multiple rest breaks                          PT Education - 02/24/15 1046    Education provided Yes   Education Details HEP, walking program   Person(s) Educated Patient   Methods Explanation   Comprehension Verbalized  understanding             PT Long Term Goals - 02/16/15 1549    PT LONG TERM GOAL #1   Title pt will improve BERG score by at least 4 points to decrease risk of falls   Baseline 44/56   Time 4   Period Weeks   Status New   PT LONG TERM GOAL #2   Title pt will improve 5x sit to stand to by at least 2.3 seconds to demonstrate increaeed functional LE strength decreaes in falls.    Baseline 25.95 seconds   Time 4   Period Weeks   Status New   PT LONG TERM GOAL #3   Title pt will ambulate with gait speed at least 0.4 m/s in order ambulate limited community    Baseline 0.38 m/s (househould ambulation)   Time 4   Period Weeks   Status New               Plan - 02/24/15 1047    Clinical Impression Statement progressed therex and NMR today. pt demonstrates poor endurance needing multiple rest breaks.discussed walking program (indoors) with pt. pt requires CGA for safety. pt reported  most of her past falls have been due to catching her toe on steps/ threshold of the door. modified treatment and HEP to address this. recommended pt do toe taps holding on to railing at home. she is unsure whether her facility will allow this. She was able to do this safely in clinic.    Pt will benefit from skilled therapeutic intervention in order to improve on the following deficits Abnormal gait;Difficulty walking;Decreased endurance;Decreased activity tolerance;Decreased balance;Decreased strength;Decreased mobility   Rehab Potential Good   PT Frequency 2x / week   PT Duration 4 weeks   PT Treatment/Interventions Functional mobility training;Patient/family education;Aquatic Therapy;Therapeutic activities;Therapeutic exercise;Balance training;Gait training;Neuromuscular re-education;Manual techniques   PT Next Visit Plan 6 min walk test        Problem List There are no active problems to display for this patient.  Gorden Harms. Misael Mcgaha, PT, DPT [phone]  Brand Siever 02/24/2015, 10:52  AM  [place] MAIN [facility] SERVICES [address] [place], [place], [address] Phone: [phone]   Fax:  [phone]

## 2015-02-28 ENCOUNTER — Encounter: Payer: Medicare Other | Admitting: Physical Therapy

## 2015-03-03 ENCOUNTER — Encounter: Payer: Self-pay | Admitting: Physical Therapy

## 2015-03-03 ENCOUNTER — Ambulatory Visit: Payer: Medicare Other | Attending: Family Medicine | Admitting: Physical Therapy

## 2015-03-03 DIAGNOSIS — R2681 Unsteadiness on feet: Secondary | ICD-10-CM | POA: Insufficient documentation

## 2015-03-03 DIAGNOSIS — R29898 Other symptoms and signs involving the musculoskeletal system: Secondary | ICD-10-CM | POA: Insufficient documentation

## 2015-03-03 DIAGNOSIS — R531 Weakness: Secondary | ICD-10-CM | POA: Insufficient documentation

## 2015-03-03 NOTE — Addendum Note (Signed)
Addended by: Mariane Masters on: 03/03/2015 01:34 PM   Modules accepted: Orders

## 2015-03-03 NOTE — Therapy (Signed)
[place] MAIN [facility] SERVICES [address] [place], [place], [address] Phone: [phone]   Fax:  [phone]  Physical Therapy Treatment  Patient Details  Name: Debbie Cruz MRN: [id number] Date of Birth: 06-Dec-1945 Referring Provider:  Anabel Bene, MD  Encounter Date: 03/03/2015      PT End of Session - 03/03/15 1308    Visit Number 4   Number of Visits 9   Date for PT Re-Evaluation 03/09/15   Authorization Type 4/10   PT Start Time 0100   PT Stop Time 0145   PT Time Calculation (min) 45 min   Equipment Utilized During Treatment Gait belt   Activity Tolerance Patient tolerated treatment well   Behavior During Therapy [facility] for tasks assessed/performed      Past Medical History  Diagnosis Date  . Anxiety   . Diabetes mellitus without complication   . Migraines   . Schizo-affective schizophrenia     Past Surgical History  Procedure Laterality Date  . Thyroidectomy      There were no vitals filed for this visit.  Visit Diagnosis:  Unsteadiness on feet  Weakness of both legs  Weakness      Subjective Assessment - 03/03/15 1307    Subjective pt reports she was a little sore after last session in her leg muscles and her left knee is hurting.    Patient Stated Goals improve balalance   Currently in Pain? Yes   Pain Score 5    Pain Location Knee   Pain Orientation Left        Therex: Nustep LE only L 2- cues for SPM >50 x 5 min ( not billed) Sitting hip abd/ER x 20 x 2 Toe taps holding 1 railing 3x10 cues for foot clearance  Fwd step up (6in) 1 railing x 10 each leg Leg press 60 #  NMR:  walking on blue balance beam x 5 fwd, x 5 sideways Tilt board side to side and fwd/bwd Toe taps on step- no UE 3x10  Blue foam with ball toss Sidesteps with L/R head turns x 3 laps in // bars no UE  Pt requires multiple rest breaks and moves slowly requiring HHA for most complex balance  training.                          PT Education - 03/03/15 1308    Education provided Yes   Education Details HEP   Person(s) Educated Patient   Methods Explanation   Comprehension Verbalized understanding             PT Long Term Goals - 02/16/15 1549    PT LONG TERM GOAL #1   Title pt will improve BERG score by at least 4 points to decrease risk of falls   Baseline 44/56   Time 4   Period Weeks   Status New   PT LONG TERM GOAL #2   Title pt will improve 5x sit to stand to by at least 2.3 seconds to demonstrate increaeed functional LE strength decreaes in falls.    Baseline 25.95 seconds   Time 4   Period Weeks   Status New   PT LONG TERM GOAL #3   Title pt will ambulate with gait speed at least 0.4 m/s in order ambulate limited community    Baseline 0.38 m/s (househould ambulation)   Time 4   Period Weeks   Status New  Plan - 03/03/15 1309    Clinical Impression Statement Patient has bursitis in her left knee that limits her abiity to participate in therapy. She will continue to benefit from skilled PT to improve her blalance and strength.    Pt will benefit from skilled therapeutic intervention in order to improve on the following deficits Abnormal gait;Difficulty walking;Decreased endurance;Decreased activity tolerance;Decreased balance;Decreased strength;Decreased mobility   Rehab Potential Good   PT Frequency 2x / week   PT Duration 4 weeks   PT Treatment/Interventions Functional mobility training;Patient/family education;Aquatic Therapy;Therapeutic activities;Therapeutic exercise;Balance training;Gait training;Neuromuscular re-education;Manual techniques   PT Next Visit Plan 6 min walk test        Problem List There are no active problems to display for this patient.   Alanson Puls 03/03/2015, 1:45 PM  [place] MAIN [facility] SERVICES [address] [place],  [place], [address] Phone: [phone]   Fax:  [phone]

## 2015-03-07 ENCOUNTER — Ambulatory Visit: Payer: Medicare Other

## 2015-03-07 DIAGNOSIS — R29898 Other symptoms and signs involving the musculoskeletal system: Secondary | ICD-10-CM | POA: Diagnosis not present

## 2015-03-07 DIAGNOSIS — F250 Schizoaffective disorder, bipolar type: Secondary | ICD-10-CM | POA: Diagnosis not present

## 2015-03-07 DIAGNOSIS — R2681 Unsteadiness on feet: Secondary | ICD-10-CM | POA: Diagnosis not present

## 2015-03-07 DIAGNOSIS — R531 Weakness: Secondary | ICD-10-CM

## 2015-03-07 NOTE — Therapy (Signed)
[place] MAIN [facility] SERVICES [address] [place], [place], [address] Phone: [phone]   Fax:  [phone]  Physical Therapy Treatment  Patient Details  Name: Debbie Cruz MRN: [id number] Date of Birth: 04/27/1946 Referring Provider:  Anabel Bene, MD  Encounter Date: 03/07/2015      PT End of Session - 03/07/15 1902    Visit Number 5   Number of Visits 9   Date for PT Re-Evaluation 03/09/15   Authorization Type 5/10   PT Start Time 6767   PT Stop Time 1600   PT Time Calculation (min) 45 min   Equipment Utilized During Treatment Gait belt   Activity Tolerance Patient tolerated treatment well   Behavior During Therapy [facility] for tasks assessed/performed      Past Medical History  Diagnosis Date  . Anxiety   . Diabetes mellitus without complication   . Migraines   . Schizo-affective schizophrenia     Past Surgical History  Procedure Laterality Date  . Thyroidectomy      There were no vitals filed for this visit.  Visit Diagnosis:  Unsteadiness on feet  Weakness of both legs  Weakness      Subjective Assessment - 03/07/15 1901    Subjective pt relates she is doing well and has not fallen since the start of PT.     Patient Stated Goals improve balalance   Currently in Pain? No/denies   Pain Score 0-No pain      Therex: Nustep LE only L 2 x 5 min ( not billed) Standing hip flexion/abduction/extension with blue band x10 each Leg press 70 # x10 with emphasis on decrease pace to improve control Leg press 60# x10 with emphasis on explosive concentric and slow eccentric   NMR: walking on blue balance beam x 5 fwd, x 5 sideways Tilt board side to side and fwd/bwd 2x10 Toe taps on step- no UE 3x10  Forward step and back 2x10 each LE Side step and back 2x10 each Forward marching with pause during single leg stance x2 laps on //bars Pt required SBA for safety and verbal cueing for correct technique   Pt  requires multiple rest breaks and moves slowly requiring SBA for safety and cues for pacing                                  PT Education - 03/07/15 1901    Education provided Yes   Education Details reviewed hip flexion.abduction, extension with band   Person(s) Educated Patient   Methods Explanation   Comprehension Verbalized understanding             PT Long Term Goals - 02/16/15 1549    PT LONG TERM GOAL #1   Title pt will improve BERG score by at least 4 points to decrease risk of falls   Baseline 44/56   Time 4   Period Weeks   Status New   PT LONG TERM GOAL #2   Title pt will improve 5x sit to stand to by at least 2.3 seconds to demonstrate increaeed functional LE strength decreaes in falls.    Baseline 25.95 seconds   Time 4   Period Weeks   Status New   PT LONG TERM GOAL #3   Title pt will ambulate with gait speed at least 0.4 m/s in order ambulate limited community    Baseline 0.38 m/s (househould  ambulation)   Time 4   Period Weeks   Status New               Plan - 03/07/15 1903    Clinical Impression Statement pt able to complete session with minimal-moderate of complaint of left medial knee pain.  pt is progressing well with neuro re-ed exercises and requires SBA for safety.     Pt will benefit from skilled therapeutic intervention in order to improve on the following deficits Abnormal gait;Difficulty walking;Decreased endurance;Decreased activity tolerance;Decreased balance;Decreased strength;Decreased mobility   Rehab Potential Good   PT Frequency 2x / week   PT Duration 4 weeks   PT Treatment/Interventions Functional mobility training;Patient/family education;Aquatic Therapy;Therapeutic activities;Therapeutic exercise;Balance training;Gait training;Neuromuscular re-education;Manual techniques   PT Next Visit Plan 6 min walk test        Problem List There are no active problems to display for this patient.  Renford Dills, SPT This entire session was performed under direct supervision and direction of a licensed Chiropractor . I have personally read, edited and approve of the note as written. Gorden Harms. Tortorici, PT, DPT [phone]  Tortorici,Ashley 03/08/2015, 8:17 AM  [place] MAIN [facility] SERVICES [address] [place], [place], [address] Phone: [phone]   Fax:  [phone]

## 2015-03-09 ENCOUNTER — Ambulatory Visit: Payer: Medicare Other

## 2015-03-09 DIAGNOSIS — R29898 Other symptoms and signs involving the musculoskeletal system: Secondary | ICD-10-CM | POA: Diagnosis not present

## 2015-03-09 DIAGNOSIS — R531 Weakness: Secondary | ICD-10-CM | POA: Diagnosis not present

## 2015-03-09 DIAGNOSIS — R2681 Unsteadiness on feet: Secondary | ICD-10-CM

## 2015-03-09 NOTE — Therapy (Signed)
[place] MAIN [facility] SERVICES [address] [place], [place], [address] Phone: [phone]   Fax:  [phone]  Physical Therapy Treatment  Patient Details  Name: Debbie Cruz MRN: [id number] Date of Birth: 10/08/1945 Referring Provider:  Anabel Bene, MD  Encounter Date: 03/09/2015      PT End of Session - 03/09/15 1530    Visit Number 6   Number of Visits 9   Date for PT Re-Evaluation 03/16/15   Authorization Type 6/10   PT Start Time 2683   PT Stop Time 1600   PT Time Calculation (min) 44 min   Equipment Utilized During Treatment Gait belt   Activity Tolerance Patient tolerated treatment well   Behavior During Therapy [facility] for tasks assessed/performed      Past Medical History  Diagnosis Date  . Anxiety   . Diabetes mellitus without complication   . Migraines   . Schizo-affective schizophrenia     Past Surgical History  Procedure Laterality Date  . Thyroidectomy      There were no vitals filed for this visit.  Visit Diagnosis:  Unsteadiness on feet  Weakness of both lower extremities  Weakness      Subjective Assessment - 03/09/15 1523    Subjective pt relates she is doing well, has no leg pain but does have a headache.    Patient Stated Goals improve balalance   Currently in Pain? Yes  headache      Therex: Leg press 60# 2x10 with emphasis on explosive concentric and slow eccentric  Sit to stand x10, pt experienced increased bilateral knee pain Hip abduction with red band 2x10 in sitting    NMR: Ambulating forward in //bars with no UE assist x 4 laps Static standing on airex with ball toss x20 One leg on 6 inch step x20 sec each Forward step and back 2x10 each LE Side step and back 2x10 each Forward marching with pause during single leg stance x2 laps on //bars Ambulating while performing dual tasks (looking left/right/down, stopping) x30 ft with cane in R UE Pt required SBA for safety and  verbal cueing for correct technique   Pt requires multiple rest breaks and moves slowly requiring SBA for safety and cues for pacing                                    PT Long Term Goals - 03/09/15 1907    PT LONG TERM GOAL #1   Title pt will improve BERG score by at least 4 points to decrease risk of falls   Baseline 44/56   Time 4   Period Weeks   Status On-going   PT LONG TERM GOAL #2   Title pt will improve 5x sit to stand to by at least 2.3 seconds to demonstrate increaeed functional LE strength decreaes in falls.    Baseline 25.95 seconds   Time 4   Period Weeks   Status On-going   PT LONG TERM GOAL #3   Title pt will ambulate with gait speed at least 0.4 m/s in order ambulate limited community    Baseline 0.38 m/s (househould ambulation)   Time 4   Period Weeks   Status On-going               Plan - 03/09/15 1904    Clinical Impression Statement pt is progressing well with physical therapy and  notes she has not fallen since the start of therapy.  pt's bilateral knee pain increases throughout session, especially during sit to stand exercise.  pt requires frequent breaks between exercies before resuming.  pt would benefit from continued skilled PT servies to make further gains, and decrease risk of falls.    Pt will benefit from skilled therapeutic intervention in order to improve on the following deficits Abnormal gait;Difficulty walking;Decreased endurance;Decreased activity tolerance;Decreased balance;Decreased strength;Decreased mobility   Rehab Potential Good   PT Frequency 2x / week   PT Duration 4 weeks   PT Treatment/Interventions Functional mobility training;Patient/family education;Aquatic Therapy;Therapeutic activities;Therapeutic exercise;Balance training;Gait training;Neuromuscular re-education;Manual techniques   PT Next Visit Plan outcome measures and 6 min walk test           G-Codes - 21-Mar-2015 1908    Functional  Assessment Tool Used --   Functional Limitation --   Mobility: Walking and Moving Around Current Status [phone]) --   Mobility: Walking and Moving Around Goal Status ([id number]) --      Problem List There are no active problems to display for this patient.  Renford Dills, SPT This entire session was performed under direct supervision and direction of a licensed Chiropractor . I have personally read, edited and approve of the note as written. Gorden Harms. Tortorici, PT, DPT [phone]  Tortorici,Ashley 03/10/2015, 1:30 PM  [place] [facility] MAIN [facility] SERVICES [address] [place], [place], [address] Phone: [phone]   Fax:  [phone]

## 2015-03-14 ENCOUNTER — Ambulatory Visit: Payer: Medicare Other

## 2015-03-14 DIAGNOSIS — R2681 Unsteadiness on feet: Secondary | ICD-10-CM | POA: Diagnosis not present

## 2015-03-14 DIAGNOSIS — R531 Weakness: Secondary | ICD-10-CM

## 2015-03-14 DIAGNOSIS — I340 Nonrheumatic mitral (valve) insufficiency: Secondary | ICD-10-CM | POA: Diagnosis not present

## 2015-03-14 DIAGNOSIS — I209 Angina pectoris, unspecified: Secondary | ICD-10-CM | POA: Diagnosis not present

## 2015-03-14 DIAGNOSIS — E780 Pure hypercholesterolemia: Secondary | ICD-10-CM | POA: Diagnosis not present

## 2015-03-14 DIAGNOSIS — G4733 Obstructive sleep apnea (adult) (pediatric): Secondary | ICD-10-CM | POA: Diagnosis not present

## 2015-03-14 DIAGNOSIS — R079 Chest pain, unspecified: Secondary | ICD-10-CM | POA: Insufficient documentation

## 2015-03-14 DIAGNOSIS — R29898 Other symptoms and signs involving the musculoskeletal system: Secondary | ICD-10-CM

## 2015-03-14 NOTE — Therapy (Signed)
[place] MAIN [facility] SERVICES [address] [place], [place], [address] Phone: [phone]   Fax:  [phone]  Physical Therapy Treatment  Patient Details  Name: Debbie Cruz MRN: [id number] Date of Birth: 1945/10/07 Referring Provider:  Anabel Bene, MD  Encounter Date: 03/14/2015      PT End of Session - 03/14/15 1229    Visit Number 7   Number of Visits 9   Date for PT Re-Evaluation 03/16/15   Authorization Type 7/10   PT Start Time 1055   PT Stop Time 1124   PT Time Calculation (min) 29 min   Equipment Utilized During Treatment Gait belt   Activity Tolerance Patient tolerated treatment well   Behavior During Therapy [facility] for tasks assessed/performed      Past Medical History  Diagnosis Date  . Anxiety   . Diabetes mellitus without complication   . Migraines   . Schizo-affective schizophrenia     Past Surgical History  Procedure Laterality Date  . Thyroidectomy      There were no vitals filed for this visit.  Visit Diagnosis:  Unsteadiness on feet  Weakness of both lower extremities  Weakness      Subjective Assessment - 03/14/15 1226    Subjective pt reports she was late due to having an appointment with her MD regarding her heart.  pt relates she will be having a workup on her heart in the near future due to history of chest pain.  pt relates she feels tired today and with 3/10 pain in her feet.    Patient Stated Goals improve balalance   Currently in Pain? Yes   Pain Score 3    Pain Location Foot   Pain Orientation Right;Left          NMR:  Leg press 60# 2x10 with emphasis on explosive concentric and slow eccentric  Sit to stand x10 with emphasis on no UE assist Static standing on airex with UE reaching with yellow ball x 2 min Static standing with eyes closed 2x10 sec Weight shift on airex x40 sec Rocking forward and backwards on airex x40 sec Forward marching with pause during single leg  stance x2 laps on //bars Pt required SBA for safety and verbal cueing for correct technique  Tilt board side to side and fwd/bwd x 30 sec each  Pt requires multiple rest breaks and moves slowly requiring SBA for safety and cues for pacing                             PT Education - 03/14/15 1228    Education provided Yes   Education Details reassess outcome measures next visit   Person(s) Educated Patient   Methods Explanation   Comprehension Verbalized understanding             PT Long Term Goals - 03/09/15 1907    PT LONG TERM GOAL #1   Title pt will improve BERG score by at least 4 points to decrease risk of falls   Baseline 44/56   Time 4   Period Weeks   Status On-going   PT LONG TERM GOAL #2   Title pt will improve 5x sit to stand to by at least 2.3 seconds to demonstrate increaeed functional LE strength decreaes in falls.    Baseline 25.95 seconds   Time 4   Period Weeks   Status On-going   PT LONG TERM  GOAL #3   Title pt will ambulate with gait speed at least 0.4 m/s in order ambulate limited community    Baseline 0.38 m/s (househould ambulation)   Time 4   Period Weeks   Status On-going               Plan - 03/14/15 1230    Clinical Impression Statement pt was able to complete session with minimal increase in LE pain and no loss of balance.  pt requires frequent rest breaks due to decreased activity tolerance.     Pt will benefit from skilled therapeutic intervention in order to improve on the following deficits Abnormal gait;Difficulty walking;Decreased endurance;Decreased activity tolerance;Decreased balance;Decreased strength;Decreased mobility   Rehab Potential Good   PT Frequency 2x / week   PT Duration 4 weeks   PT Treatment/Interventions Functional mobility training;Patient/family education;Aquatic Therapy;Therapeutic activities;Therapeutic exercise;Balance training;Gait training;Neuromuscular re-education;Manual techniques    PT Next Visit Plan outcome measures and 6 min walk test         Problem List There are no active problems to display for this patient.  Renford Dills, SPT   This entire session was performed under direct supervision and direction of a licensed Chiropractor . I have personally read, edited and approve of the note as written. Gorden Harms. Tortorici, PT, DPT [phone]  Tortorici,Ashley 03/14/2015, 2:39 PM  [place] MAIN [facility] SERVICES [address] [place], [place], [address] Phone: [phone]   Fax:  [phone]

## 2015-03-16 ENCOUNTER — Ambulatory Visit: Payer: Medicare Other

## 2015-03-16 DIAGNOSIS — R531 Weakness: Secondary | ICD-10-CM

## 2015-03-16 DIAGNOSIS — R29898 Other symptoms and signs involving the musculoskeletal system: Secondary | ICD-10-CM

## 2015-03-16 DIAGNOSIS — R2681 Unsteadiness on feet: Secondary | ICD-10-CM | POA: Diagnosis not present

## 2015-03-17 DIAGNOSIS — C44311 Basal cell carcinoma of skin of nose: Secondary | ICD-10-CM | POA: Diagnosis not present

## 2015-03-17 NOTE — Therapy (Signed)
[place] MAIN [facility] SERVICES [address] [place], [place], [address] Phone: [phone]   Fax:  [phone]  Physical Therapy Treatment/Discharge Note 7/20-8/17/2016  Patient Details  Name: Debbie Cruz MRN: [id number] Date of Birth: [birth year] Referring Provider:  Anabel Bene, MD  Encounter Date: 03/16/2015      PT End of Session - 03/17/15 0929    Visit Number 8   Number of Visits 9   Date for PT Re-Evaluation 03/16/15   Authorization Type 8/10   PT Start Time 1035   PT Stop Time 1115   PT Time Calculation (min) 40 min   Equipment Utilized During Treatment Gait belt   Activity Tolerance Patient tolerated treatment well   Behavior During Therapy [facility] for tasks assessed/performed      Past Medical History  Diagnosis Date  . Anxiety   . Diabetes mellitus without complication   . Migraines   . Schizo-affective schizophrenia     Past Surgical History  Procedure Laterality Date  . Thyroidectomy      There were no vitals filed for this visit.  Visit Diagnosis:  Unsteadiness on feet  Weakness of both lower extremities  Weakness      Subjective Assessment - 03/16/15 1039    Subjective pt reports she is doing well overall but did not sleep well last night.  pt denies any recent or near falls.  pt reports pain in buttocks which she "ignores."  pt reports since the start of PT she feels more "fit" and "stronger."     Patient Stated Goals improve balalance   Currently in Pain? Yes   Pain Score --  "ignores pain"   Pain Location Buttocks   Pain Orientation Right;Left      SPT reassessed outcome measures and progress towards goals as follows:  5x sit to stand: 15.99 10 meter gait speed: 0.86 m/s TUG: 10.02 Berg Balance: 52/56                          PT Education - 03/17/15 0929    Education provided Yes   Education Details plan of care, outcome measure results    Person(s) Educated  Patient   Methods Explanation   Comprehension Verbalized understanding             PT Long Term Goals - 03/17/15 0939    PT LONG TERM GOAL #1   Title pt will improve BERG score by at least 4 points to decrease risk of falls   Baseline 44/56 and now 52/56   Time 4   Period Weeks   Status Achieved   PT LONG TERM GOAL #2   Title pt will improve 5x sit to stand to by at least 2.3 seconds to demonstrate increaeed functional LE strength decreaes in falls.    Baseline 15.99 seconds   Time 4   Period Weeks   Status Achieved   PT LONG TERM GOAL #3   Title pt will ambulate with gait speed at least 0.4 m/s in order ambulate limited community    Baseline 0.86 m/s : community ambulator    Time 4   Period Weeks   Status Achieved               Plan - 03/17/15 0930    Clinical Impression Statement pt has made significant progress with PT and has met all her goals with PT.  Based on her outcome measure results,  she is now at low risk of falls, has improved functional LE strength and improved balance.  pt would likely benefit from continued skilled PT services to make further gains and pt elects not to continue PT due to making sufficient progress.  PT is discharged from PT with a HEP.       Pt will benefit from skilled therapeutic intervention in order to improve on the following deficits Abnormal gait;Difficulty walking;Decreased endurance;Decreased activity tolerance;Decreased balance;Decreased strength;Decreased mobility   Rehab Potential Good   PT Treatment/Interventions Functional mobility training;Patient/family education;Therapeutic activities;Therapeutic exercise;Balance training;Gait training;Neuromuscular re-education;Manual techniques          G-Codes - 09-Apr-2015 0941    Functional Assessment Tool Used history, outcome measures, clinical judgment   Functional Limitation Mobility: Walking and moving around   Mobility: Walking and Moving Around Current Status [phone]) --    Mobility: Walking and Moving Around Goal Status [phone]) At least 1 percent but less than 20 percent impaired, limited or restricted   Mobility: Walking and Moving Around Discharge Status [phone]) At least 1 percent but less than 20 percent impaired, limited or restricted      Problem List There are no active problems to display for this patient. Renford Dills, SPT  This entire session was performed under direct supervision and direction of a licensed Chiropractor . I have personally read, edited and approve of the note as written. Gorden Harms. Tortorici, PT, DPT [phone]   Tortorici,Ashley 2015/04/09, 10:58 AM  [place] MAIN [facility] SERVICES [address] [place], [place], [address] Phone: [phone]   Fax:  [phone]

## 2015-03-23 ENCOUNTER — Other Ambulatory Visit: Payer: Self-pay | Admitting: Family Medicine

## 2015-03-23 ENCOUNTER — Ambulatory Visit
Admission: RE | Admit: 2015-03-23 | Discharge: 2015-03-23 | Disposition: A | Discharge status: Home / Self Care | Payer: Medicare Other | Source: Ambulatory Visit | Attending: Family Medicine | Admitting: Family Medicine

## 2015-03-23 DIAGNOSIS — Z1231 Encounter for screening mammogram for malignant neoplasm of breast: Secondary | ICD-10-CM | POA: Insufficient documentation

## 2015-03-23 DIAGNOSIS — Z13820 Encounter for screening for osteoporosis: Secondary | ICD-10-CM | POA: Diagnosis not present

## 2015-03-23 DIAGNOSIS — Z780 Asymptomatic menopausal state: Secondary | ICD-10-CM | POA: Insufficient documentation

## 2015-03-23 HISTORY — DX: Malignant (primary) neoplasm, unspecified: C80.1

## 2015-03-24 DIAGNOSIS — I340 Nonrheumatic mitral (valve) insufficiency: Secondary | ICD-10-CM | POA: Diagnosis not present

## 2015-03-24 DIAGNOSIS — G4733 Obstructive sleep apnea (adult) (pediatric): Secondary | ICD-10-CM | POA: Diagnosis not present

## 2015-03-24 DIAGNOSIS — E780 Pure hypercholesterolemia: Secondary | ICD-10-CM | POA: Diagnosis not present

## 2015-03-24 DIAGNOSIS — I209 Angina pectoris, unspecified: Secondary | ICD-10-CM | POA: Diagnosis not present

## 2015-03-24 DIAGNOSIS — R079 Chest pain, unspecified: Secondary | ICD-10-CM | POA: Diagnosis not present

## 2015-03-25 DIAGNOSIS — Z48817 Encounter for surgical aftercare following surgery on the skin and subcutaneous tissue: Secondary | ICD-10-CM | POA: Diagnosis not present

## 2015-03-30 DIAGNOSIS — L600 Ingrowing nail: Secondary | ICD-10-CM | POA: Diagnosis not present

## 2015-04-28 DIAGNOSIS — Z1283 Encounter for screening for malignant neoplasm of skin: Secondary | ICD-10-CM | POA: Diagnosis not present

## 2015-04-28 DIAGNOSIS — B078 Other viral warts: Secondary | ICD-10-CM | POA: Diagnosis not present

## 2015-04-28 DIAGNOSIS — L918 Other hypertrophic disorders of the skin: Secondary | ICD-10-CM | POA: Diagnosis not present

## 2015-05-18 DIAGNOSIS — R51 Headache: Secondary | ICD-10-CM

## 2015-05-18 DIAGNOSIS — R519 Headache, unspecified: Secondary | ICD-10-CM | POA: Insufficient documentation

## 2015-05-18 DIAGNOSIS — G4739 Other sleep apnea: Secondary | ICD-10-CM | POA: Diagnosis not present

## 2015-05-18 DIAGNOSIS — R262 Difficulty in walking, not elsewhere classified: Secondary | ICD-10-CM | POA: Diagnosis not present

## 2015-05-18 DIAGNOSIS — R413 Other amnesia: Secondary | ICD-10-CM | POA: Diagnosis not present

## 2015-05-24 ENCOUNTER — Ambulatory Visit: Payer: Self-pay | Admitting: Obstetrics and Gynecology

## 2015-05-24 ENCOUNTER — Ambulatory Visit (INDEPENDENT_AMBULATORY_CARE_PROVIDER_SITE_OTHER): Payer: Medicare Other | Admitting: Urology

## 2015-05-24 ENCOUNTER — Encounter: Payer: Self-pay | Admitting: *Deleted

## 2015-05-24 VITALS — BP 128/68 | HR 75 | Ht 60.0 in | Wt 231.3 lb

## 2015-05-24 DIAGNOSIS — R32 Unspecified urinary incontinence: Secondary | ICD-10-CM | POA: Diagnosis not present

## 2015-05-24 LAB — BLADDER SCAN AMB NON-IMAGING: Scan Result: 13

## 2015-05-24 NOTE — Progress Notes (Signed)
05/24/2015 10:09 PM   Debbie Cruz 1946-04-03 [id number]  Referring provider: No referring provider defined for this encounter.  Chief Complaint  Patient presents with  . Urinary Incontinence    referred by Dr. Jacqlyn Larsen    HPI: Patient is a 69 year old white female with urinary incontinence who was previously managed by Dr. Jacqlyn Larsen, but she continue her treatment and [facility].   Patient's symptoms consist of frequent urination, urgency, nocturia 1, leakage of urine and trouble starting urinary stream.  She wears 2 pull-ups at one time. She goes through 2-3 pull-ups a day. She has a constant dribble. She suffers from both stress and urge incontinence. She has not had recurrent urinary tract infections or gross hematuria.  She has had these symptoms for years.  She had been on Myrbetriq 50 mg daily, but she is no longer finding it effective.  Her UA today is unremarkable.  She denies dysuria, hematuria or suprapubic pain.  PMH: Past Medical History  Diagnosis Date  . Anxiety   . Diabetes mellitus without complication ([place])   . Migraines   . Schizo-affective schizophrenia ([place])   . Cancer ([place])     skin  . Acid reflux   . Arthritis   . Depression   . Heart disease   . Heart murmur   . HLD (hyperlipidemia)   . Thyroid disease     Surgical History: Past Surgical History  Procedure Laterality Date  . Thyroidectomy  2000  . Ulna nerve surgery Bilateral 2003/2006  . Cesarean section  1972  . Carpal tunnel release  2003/2006    Home Medications:    Medication List       This list is accurate as of: 05/24/15 11:59 PM.  Always use your most recent med list.               acetaminophen 500 MG chewable tablet  Commonly known as:  TYLENOL  Chew 500 mg by mouth every 6 (six) hours as needed for pain.     buPROPion 100 MG tablet  Commonly known as:  WELLBUTRIN  Take 100 mg by mouth 2 (two) times daily.     butalbital-acetaminophen-caffeine  50-325-40 MG tablet  Commonly known as:  FIORICET  Take 1-2 tablets by mouth every 6 (six) hours as needed for headache (for two days.).     calcium-vitamin D 500-200 MG-UNIT tablet  Take 1 tablet by mouth daily.     donepezil 5 MG tablet  Commonly known as:  ARICEPT     gabapentin 300 MG capsule  Commonly known as:  NEURONTIN  Take 300 mg by mouth.     HYDROcodone-acetaminophen 5-325 MG tablet  Commonly known as:  NORCO/VICODIN     levothyroxine 125 MCG tablet  Commonly known as:  SYNTHROID, LEVOTHROID  Take 125 mcg by mouth daily before breakfast.     loratadine 10 MG tablet  Commonly known as:  CLARITIN  Take 10 mg by mouth.     mirabegron ER 50 MG Tb24 tablet  Commonly known as:  MYRBETRIQ  Take 50 mg by mouth.     multivitamin with minerals tablet  Take by mouth.     nystatin 100000 UNITS vaginal tablet  Place vaginally.     pantoprazole 40 MG tablet  Commonly known as:  PROTONIX  TAKE ONE TABLET BY MOUTH EACH DAY. *DO NOT CRUSH*     polyethylene glycol packet  Commonly known as:  MIRALAX / GLYCOLAX  Take by mouth.  pravastatin 40 MG tablet  Commonly known as:  PRAVACHOL  Take 40 mg by mouth.     promethazine 25 MG tablet  Commonly known as:  PHENERGAN  1/2 to 1 po every 6 hours for nausea if needed     risperiDONE 0.5 MG tablet  Commonly known as:  RISPERDAL  Take 0.5 mg by mouth at bedtime.     sertraline 100 MG tablet  Commonly known as:  ZOLOFT  Take 100 mg by mouth daily.     sodium chloride 0.65 % nasal spray  Commonly known as:  OCEAN  Place into the nose.     topiramate 25 MG tablet  Commonly known as:  TOPAMAX  Take 1 capsule at night for 1 week, then increase to 2 capsules at night and continue     triamcinolone acetonide 40 MG/ML SUSP 40 mg, mupirocin cream 2 % CREA 15 g  Apply 1 application topically 2 (two) times daily.        Allergies:  Allergies  Allergen Reactions  . Oxytetracycline Hives  . Amoxicillin-Pot  Clavulanate Rash  . Butalbital-Apap-Caffeine Itching  . Tetracyclines & Related     Teramycin  . Sulfa Antibiotics Rash  . Ultram [Tramadol] Rash    Family History: Family History  Problem Relation Age of Onset  . Breast cancer Maternal Grandmother   . Breast cancer Maternal Grandfather   . Hematuria Father     Social History:  reports that she has never smoked. She does not have any smokeless tobacco history on file. She reports that she does not drink alcohol or use illicit drugs.  ROS: UROLOGY Frequent Urination?: Yes Hard to postpone urination?: Yes Burning/pain with urination?: No Get up at night to urinate?: Yes Leakage of urine?: Yes Urine stream starts and stops?: No Trouble starting stream?: Yes Do you have to strain to urinate?: No Blood in urine?: No Urinary tract infection?: No Sexually transmitted disease?: No Injury to kidneys or bladder?: No Painful intercourse?: No Weak stream?: No Currently pregnant?: No Vaginal bleeding?: No Last menstrual period?: n  Gastrointestinal Nausea?: No Vomiting?: No Indigestion/heartburn?: Yes Diarrhea?: No Constipation?: Yes  Constitutional Fever: No Night sweats?: No Weight loss?: No Fatigue?: Yes  Skin Skin rash/lesions?: No Itching?: No  Eyes Blurred vision?: No Double vision?: No  Ears/Nose/Throat Sore throat?: No Sinus problems?: Yes  Hematologic/Lymphatic Swollen glands?: No Easy bruising?: Yes  Cardiovascular Leg swelling?: Yes Chest pain?: Yes  Respiratory Cough?: Yes Shortness of breath?: No  Endocrine Excessive thirst?: No  Musculoskeletal Back pain?: Yes Joint pain?: Yes  Neurological Headaches?: Yes Dizziness?: No  Psychologic Depression?: Yes Anxiety?: Yes  Physical Exam: BP 128/68 mmHg  Pulse 75  Ht 5' (1.524 m)  Wt 231 lb 4.8 oz (104.917 kg)  BMI 45.17 kg/m2  Constitutional: Well nourished. Alert and oriented, No acute distress. HEENT: [place] AT, moist mucus  membranes. Trachea midline, no masses. Cardiovascular: No clubbing, cyanosis, or edema. Respiratory: Normal respiratory effort, no increased work of breathing. GI: Abdomen is soft, non tender, non distended, no abdominal masses. Liver and spleen not palpable.  No hernias appreciated.  Stool sample for occult testing is not indicated.   GU: No CVA tenderness.  No bladder fullness or masses.  Normal external female genitalia.   Rectal: Patient with  normal sphincter tone. Anus and perineum without scarring or rashes. No rectal masses are appreciated.  Skin: No rashes, bruises or suspicious lesions. Lymph: No cervical or inguinal adenopathy. Neurologic: Grossly intact, no focal  deficits, moving all 4 extremities. Psychiatric: Normal mood and affect.  Laboratory Data: Lab Results  Component Value Date   WBC 4.6 11/06/2014   HGB 11.5* 11/06/2014   HCT 33.9* 11/06/2014   MCV 98 11/06/2014   PLT 213 11/06/2014    Lab Results  Component Value Date   CREATININE 1.01* 11/06/2014    Urinalysis Results for orders placed or performed in visit on 05/24/15  Microscopic Examination  Result Value Ref Range   WBC, UA 0-5 0 -  5 /hpf   RBC, UA None seen 0 -  2 /hpf   Epithelial Cells (non renal) 0-10 0 - 10 /hpf   Renal Epithel, UA None seen None seen /hpf   Bacteria, UA None seen None seen/Few  Urinalysis, Complete  Result Value Ref Range   Specific Gravity, UA 1.010 1.005 - 1.030   pH, UA 7.0 5.0 - 7.5   Color, UA Yellow Yellow   Appearance Ur Clear Clear   Leukocytes, UA 2+ (A) Negative   Protein, UA Negative Negative/Trace   Glucose, UA Negative Negative   Ketones, UA Negative Negative   RBC, UA Negative Negative   Bilirubin, UA Negative Negative   Urobilinogen, Ur 0.2 0.2 - 1.0 mg/dL   Nitrite, UA Negative Negative   Microscopic Examination See below:   BLADDER SCAN AMB NON-IMAGING  Result Value Ref Range   Scan Result 13    Pertinent Imaging: Results for JISSELLE, POTH  (MRN [id number]) as of 05/29/2015 22:02  Ref. Range 05/24/2015 15:26  Scan Result Unknown 13    Assessment & Plan:    1. Urinary incontinence, unspecified incontinence type:   Patient has significant memory issues, therefore, anticholinergics or not option for her. She feels that Myrbetriq is no longer effective for her.  We discussed PNE, Botox or PTNS therapy.  She would like to have PTNS therapy. We will schedule her for her 12 weeks.  - Urinalysis, Complete - BLADDER SCAN AMB NON-IMAGING   Return for PTNS.  Zara Council, [facility] [address] [place], [place] [address] [phone]

## 2015-05-25 LAB — URINALYSIS, COMPLETE
BILIRUBIN UA: NEGATIVE
GLUCOSE, UA: NEGATIVE
Ketones, UA: NEGATIVE
NITRITE UA: NEGATIVE
PH UA: 7 (ref 5.0–7.5)
Protein, UA: NEGATIVE
RBC UA: NEGATIVE
Specific Gravity, UA: 1.01 (ref 1.005–1.030)
UUROB: 0.2 mg/dL (ref 0.2–1.0)

## 2015-05-25 LAB — MICROSCOPIC EXAMINATION
Bacteria, UA: NONE SEEN
RBC MICROSCOPIC, UA: NONE SEEN /HPF (ref 0–?)
Renal Epithel, UA: NONE SEEN /hpf

## 2015-05-29 DIAGNOSIS — R32 Unspecified urinary incontinence: Secondary | ICD-10-CM | POA: Insufficient documentation

## 2015-06-01 ENCOUNTER — Ambulatory Visit: Payer: Medicare Other | Admitting: Urology

## 2015-06-07 DIAGNOSIS — F250 Schizoaffective disorder, bipolar type: Secondary | ICD-10-CM | POA: Diagnosis not present

## 2015-06-07 DIAGNOSIS — Z79899 Other long term (current) drug therapy: Secondary | ICD-10-CM | POA: Diagnosis not present

## 2015-06-08 ENCOUNTER — Ambulatory Visit (INDEPENDENT_AMBULATORY_CARE_PROVIDER_SITE_OTHER): Payer: Medicare Other | Admitting: Urology

## 2015-06-08 ENCOUNTER — Encounter: Payer: Self-pay | Admitting: Urology

## 2015-06-08 VITALS — BP 103/67 | HR 69 | Ht 60.0 in | Wt 228.6 lb

## 2015-06-08 DIAGNOSIS — N3946 Mixed incontinence: Secondary | ICD-10-CM

## 2015-06-08 DIAGNOSIS — F259 Schizoaffective disorder, unspecified: Secondary | ICD-10-CM | POA: Insufficient documentation

## 2015-06-08 LAB — PTNS-PERCUTANEOUS TIBIAL NERVE STIMULATION: SCAN RESULT: 6

## 2015-06-08 NOTE — Progress Notes (Signed)
Chief Complaint:  Chief Complaint  Patient presents with  . Urinary Incontinence    PTNS  1     HPI: Patient is a 69 year old white female who presents today for her first PTNS treatment.    Background story Patient is a 69 year old white female with urinary incontinence who was previously managed by Dr. Jacqlyn Larsen, but she continue her treatment and [facility].  Patient's symptoms consist of frequent urination, urgency, nocturia 1, leakage of urine and trouble starting urinary stream. She wears 2 pull-ups at one time. She goes through 2-3 pull-ups a day. She has a constant dribble. She suffers from both stress and urge incontinence. She has not had recurrent urinary tract infections or gross hematuria. She has had these symptoms for years.  She had been on Myrbetriq 50 mg daily, but she is no longer finding it effective. She denies dysuria, hematuria or suprapubic pain.  Patient states her symptoms began several years ago and are not related to a specific event.  She states she continuously leaks and wear depends constantly.   Her leakage occurs during the day.  She loses urine when she coughs, sneezes, or laughs.  She also loses urine while walking, changes in position and when bending/lifting.  She is continuously leaking.  She is experiencing daytime frequency of 9 visits to the bathroom during the day, nighttime frequency of one time a night, she is not having pain or burning with urination and she has the perception of the need to urinate.    Incontinence episodes:   Leaks all day Protection used heavy pads, 2 to 3 pull-ups daily Fluid intake #4 of cups of fluid per day,  #4 of cups of caffeine beverages per day and  # 0 alcohol intake per day.       Previous Therapy:     Patient has not responded to anticholinergics or Myrbetriq.  BP 103/67 mmHg  Pulse 69  Ht 5' (1.524 m)  Wt 228 lb 9.6 oz (103.692 kg)  BMI 44.65 kg/m2  Patient did not have any contraindications  present for PTNS, such as: pacemaker, implantable defibrillator, history of abnormal bleeding or a history of neuropathies or nerve damage.    I discussed with patient possible complications of procedure, such as discomfort, bleeding at insertion/stimulation site, procedure consent signed  Patient goals: Her goals are to not have accidents and to sleep through the night.    PTNS treatment: The needle electrode was inserted into the lower, inner aspect of the patient's right leg. The surface electrode was placed on the inside arch of the foot on the treatment leg. The lead set was connected to the stimulator, and the needle electrode clip was connected to the needle electrode. The stimulator that produces an adjustable electrical pulse that travels to sacral nerve plexus via the tibial nerve was increased to 6 and the patient receiveda toe flex and a sensory response.  Treatment Plan:  Patient tolerated the PTNS treatment for 30 minutes without difficulty. The electrode was removed from her leg without difficulty. She will return next week for her 2nd PTNS treatment.

## 2015-06-15 ENCOUNTER — Ambulatory Visit (INDEPENDENT_AMBULATORY_CARE_PROVIDER_SITE_OTHER): Payer: Medicare Other | Admitting: Urology

## 2015-06-15 ENCOUNTER — Encounter: Payer: Self-pay | Admitting: Urology

## 2015-06-15 VITALS — BP 109/54 | HR 72 | Ht 60.0 in | Wt 229.2 lb

## 2015-06-15 DIAGNOSIS — R32 Unspecified urinary incontinence: Secondary | ICD-10-CM

## 2015-06-15 LAB — PTNS-PERCUTANEOUS TIBIAL NERVE STIMULATION: Scan Result: 7

## 2015-06-16 NOTE — Progress Notes (Signed)
Chief Complaint:  Chief Complaint  Patient presents with  . Urinary Incontinence    PTNS     HPI: Patient is a 69 year old white female who presents today for her second PTNS treatment.    Background story Patient is a 69 year old white female with urinary incontinence who was previously managed by Dr. Jacqlyn Larsen, but she continue her treatment and [facility].  Patient's symptoms consist of frequent urination, urgency, nocturia 1, leakage of urine and trouble starting urinary stream. She wears 2 pull-ups at one time. She goes through 2-3 pull-ups a day. She has a constant dribble. She suffers from both stress and urge incontinence. She has not had recurrent urinary tract infections or gross hematuria. She has had these symptoms for years.  She had been on Myrbetriq 50 mg daily, but she is no longer finding it effective. She denies dysuria, hematuria or suprapubic pain.  Patient states her symptoms began several years ago and are not related to a specific event.  She states she continuously leaks and wear depends constantly.   Her leakage occurs during the day.  She loses urine when she coughs, sneezes, or laughs.  She also loses urine while walking, changes in position and when bending/lifting.  She is continuously leaking.    After her last treatment, she is experiencing daytime frequency of 9 visits (unchanged) to the bathroom during the day, nighttime frequency of 2 times a night (worsening), she is not having pain or burning with urination and she has the perception of the need to urinate.    Incontinence episodes:   One episode per day Protection used heavy pads, 2 to 3 pull-ups daily Fluid intake #4 of cups of fluid per day,  #4 of cups of caffeine beverages per day and  # 0 alcohol intake per day.       Previous Therapy:     Patient has not responded to anticholinergics or Myrbetriq.  Blood pressure 109/54, pulse 72, height 5' (1.524 m), weight 229 lb 3.2 oz (103.964  kg).  Patient did not have any contraindications present for PTNS, such as: pacemaker, implantable defibrillator, history of abnormal bleeding or a history of neuropathies or nerve damage.    I discussed with patient possible complications of procedure, such as discomfort, bleeding at insertion/stimulation site, procedure consent signed  Patient goals: Her goals are to not have accidents and to sleep through the night.    PTNS treatment: The needle electrode was inserted into the lower, inner aspect of the patient's right leg. The surface electrode was placed on the inside arch of the foot on the treatment leg. The lead set was connected to the stimulator, and the needle electrode clip was connected to the needle electrode. The stimulator that produces an adjustable electrical pulse that travels to sacral nerve plexus via the tibial nerve was increased to 7 and the patient received a sensory response.  Treatment Plan:  Patient tolerated the PTNS treatment for 30 minutes without difficulty. The electrode was removed from her leg without difficulty. She will return next week for her 3rd PTNS treatment.

## 2015-06-21 DIAGNOSIS — M1990 Unspecified osteoarthritis, unspecified site: Secondary | ICD-10-CM | POA: Insufficient documentation

## 2015-06-21 DIAGNOSIS — Z23 Encounter for immunization: Secondary | ICD-10-CM | POA: Diagnosis not present

## 2015-06-21 DIAGNOSIS — R739 Hyperglycemia, unspecified: Secondary | ICD-10-CM | POA: Diagnosis not present

## 2015-06-21 DIAGNOSIS — F3342 Major depressive disorder, recurrent, in full remission: Secondary | ICD-10-CM | POA: Diagnosis not present

## 2015-06-21 DIAGNOSIS — R110 Nausea: Secondary | ICD-10-CM | POA: Diagnosis not present

## 2015-06-21 DIAGNOSIS — Z6841 Body Mass Index (BMI) 40.0 and over, adult: Secondary | ICD-10-CM

## 2015-06-21 DIAGNOSIS — E039 Hypothyroidism, unspecified: Secondary | ICD-10-CM | POA: Diagnosis not present

## 2015-06-22 ENCOUNTER — Encounter: Payer: Self-pay | Admitting: Urology

## 2015-06-22 ENCOUNTER — Ambulatory Visit (INDEPENDENT_AMBULATORY_CARE_PROVIDER_SITE_OTHER): Payer: Medicare Other | Admitting: Urology

## 2015-06-22 VITALS — BP 115/58 | HR 71 | Ht 60.0 in | Wt 227.8 lb

## 2015-06-22 DIAGNOSIS — R32 Unspecified urinary incontinence: Secondary | ICD-10-CM | POA: Diagnosis not present

## 2015-06-22 LAB — PTNS-PERCUTANEOUS TIBIAL NERVE STIMULATION: Scan Result: 7

## 2015-06-22 NOTE — Progress Notes (Signed)
Chief Complaint:  Chief Complaint  Patient presents with  . Urinary Incontinence    PTNS     HPI: Patient is a 69 year old white female who presents today for her third PTNS treatment.    Background story Patient is a 69 year old white female with urinary incontinence who was previously managed by Dr. Jacqlyn Larsen, but she continue her treatment and [facility].  Patient's symptoms consist of frequent urination, urgency, nocturia 1, leakage of urine and trouble starting urinary stream. She wears 2 pull-ups at one time. She goes through 2-3 pull-ups a day. She has a constant dribble. She suffers from both stress and urge incontinence. She has not had recurrent urinary tract infections or gross hematuria. She has had these symptoms for years.  She had been on Myrbetriq 50 mg daily, but she is no longer finding it effective. She denies dysuria, hematuria or suprapubic pain.  Patient states her symptoms began several years ago and are not related to a specific event.  She states she continuously leaks and wear depends constantly.   Her leakage occurs during the day.  She loses urine when she coughs, sneezes, or laughs.  She also loses urine while walking, changes in position and when bending/lifting.  She is continuously leaking.    After her last treatment, she is experiencing daytime frequency of 8 visits (improved) to the bathroom during the day, nighttime frequency of 1 times a night (improvement), she is not having pain or burning with urination and she has the perception of the need to urinate.    Incontinence episodes:   One episode per day Protection used heavy pads, 2 to 3 pull-ups daily Fluid intake #4 of cups of fluid per day,  #4 of cups of caffeine beverages per day and  # 0 alcohol intake per day.       Previous Therapy:     Patient has not responded to anticholinergics or Myrbetriq.  Blood pressure 115/58, pulse 71, height 5' (1.524 m), weight 227 lb 12.8 oz (103.329  kg).  Patient did not have any contraindications present for PTNS, such as: pacemaker, implantable defibrillator, history of abnormal bleeding or a history of neuropathies or nerve damage.    I discussed with patient possible complications of procedure, such as discomfort, bleeding at insertion/stimulation site, procedure consent signed  Patient goals: Her goals are to not have accidents and to sleep through the night.    PTNS treatment: The needle electrode was inserted into the lower, inner aspect of the patient's left leg. The surface electrode was placed on the inside arch of the foot on the treatment leg. The lead set was connected to the stimulator, and the needle electrode clip was connected to the needle electrode. The stimulator that produces an adjustable electrical pulse that travels to sacral nerve plexus via the tibial nerve was increased to 7 and the patient received a sensory response.  Treatment Plan:  Patient tolerated the PTNS treatment for 30 minutes without difficulty. The electrode was removed from her leg without difficulty. She will return next week for her 4th PTNS treatment.

## 2015-06-29 ENCOUNTER — Ambulatory Visit (INDEPENDENT_AMBULATORY_CARE_PROVIDER_SITE_OTHER): Payer: Medicare Other | Admitting: Urology

## 2015-06-29 ENCOUNTER — Encounter: Payer: Self-pay | Admitting: Urology

## 2015-06-29 VITALS — BP 120/48 | HR 80 | Resp 16 | Ht 60.0 in | Wt 229.3 lb

## 2015-06-29 DIAGNOSIS — R32 Unspecified urinary incontinence: Secondary | ICD-10-CM | POA: Diagnosis not present

## 2015-06-29 LAB — PTNS-PERCUTANEOUS TIBIAL NERVE STIMULATION: SCAN RESULT: 7

## 2015-06-29 NOTE — Progress Notes (Signed)
Chief Complaint:  Chief Complaint  Patient presents with  . Urinary Incontinence    PTNS     HPI: Patient is a 69 year old white female who presents today for her fourth PTNS treatment.    Background story Patient is a 69 year old white female with urinary incontinence who was previously managed by Dr. Jacqlyn Larsen, but she continue her treatment and [facility].  Patient's symptoms consist of frequent urination, urgency, nocturia 1, leakage of urine and trouble starting urinary stream. She wears 2 pull-ups at one time. She goes through 2-3 pull-ups a day. She has a constant dribble. She suffers from both stress and urge incontinence. She has not had recurrent urinary tract infections or gross hematuria. She has had these symptoms for years.  She had been on Myrbetriq 50 mg daily, but she is no longer finding it effective. She denies dysuria, hematuria or suprapubic pain.  Patient states her symptoms began several years ago and are not related to a specific event.  She states she continuously leaks and wear depends constantly.   Her leakage occurs during the day.  She loses urine when she coughs, sneezes, or laughs.  She also loses urine while walking, changes in position and when bending/lifting.  She is continuously leaking.    After her last treatment, she is experiencing daytime frequency of 7 visits (improved) to the bathroom during the day, nighttime frequency of 1 times a night (stable), she is not having pain or burning with urination and she has the perception of the need to urinate.    Incontinence episodes:   0 episode per day Protection used heavy pads, 2 to 3 pull-ups daily Fluid intake #4 of cups of fluid per day,  #3 of cups of caffeine beverages per day and  # 0 alcohol intake per day.       Previous Therapy:     Patient has not responded to anticholinergics or Myrbetriq.  Blood pressure 120/48, pulse 80, resp. rate 16, height 5' (1.524 m), weight 229 lb 4.8 oz  (104.01 kg).  Patient did not have any contraindications present for PTNS, such as: pacemaker, implantable defibrillator, history of abnormal bleeding or a history of neuropathies or nerve damage.    I discussed with patient possible complications of procedure, such as discomfort, bleeding at insertion/stimulation site, procedure consent signed  Patient goals: Her goals are to not have accidents and to sleep through the night.    PTNS treatment: The needle electrode was inserted into the lower, inner aspect of the patient's right leg. The surface electrode was placed on the inside arch of the foot on the treatment leg. The lead set was connected to the stimulator, and the needle electrode clip was connected to the needle electrode. The stimulator that produces an adjustable electrical pulse that travels to sacral nerve plexus via the tibial nerve was increased to 7 and the patient received a sensory response.  Treatment Plan:  Patient tolerated the PTNS treatment for 30 minutes without difficulty. The electrode was removed from her leg without difficulty. She will return next week for her 5th PTNS treatment.

## 2015-06-29 NOTE — Progress Notes (Signed)
PTNS  Session # 4  Health & Social Factors: No change Caffeine: 3 Alcohol: 0 Daytime voids #per day: 7 Night-time voids #per night: 1  Urgency: > mild Incontinence Episodes #per day: 0 Ankle used: right Treatment Setting: 7 Feeling/ Response: sensory

## 2015-07-06 ENCOUNTER — Encounter: Payer: Self-pay | Admitting: Urology

## 2015-07-06 ENCOUNTER — Ambulatory Visit (INDEPENDENT_AMBULATORY_CARE_PROVIDER_SITE_OTHER): Payer: Medicare Other | Admitting: Urology

## 2015-07-06 VITALS — BP 108/62 | HR 74 | Ht 60.0 in | Wt 228.2 lb

## 2015-07-06 DIAGNOSIS — R32 Unspecified urinary incontinence: Secondary | ICD-10-CM | POA: Diagnosis not present

## 2015-07-06 LAB — PTNS-PERCUTANEOUS TIBIAL NERVE STIMULATION: SCAN RESULT: 6

## 2015-07-06 NOTE — Progress Notes (Signed)
Chief Complaint:  Chief Complaint  Patient presents with  . Urinary Incontinence    PTNS     HPI: Patient is a 69 year old white female who presents today for her fifth PTNS treatment.    Background story Patient is a 69 year old white female with urinary incontinence who was previously managed by Dr. Jacqlyn Larsen, but she continue her treatment and [facility].  Patient's symptoms consist of frequent urination, urgency, nocturia 1, leakage of urine and trouble starting urinary stream. She wears 2 pull-ups at one time. She goes through 2-3 pull-ups a day. She has a constant dribble. She suffers from both stress and urge incontinence. She has not had recurrent urinary tract infections or gross hematuria. She has had these symptoms for years.  She had been on Myrbetriq 50 mg daily, but she is no longer finding it effective. She denies dysuria, hematuria or suprapubic pain.  Patient states her symptoms began several years ago and are not related to a specific event.  She states she continuously leaks and wear depends constantly.   Her leakage occurs during the day.  She loses urine when she coughs, sneezes, or laughs.  She also loses urine while walking, changes in position and when bending/lifting.  She is continuously leaking.    After her last treatment, she is experiencing daytime frequency of 5 visits (improved) to the bathroom during the day, nighttime frequency of 1 times a night (stable), she is not having pain or burning with urination and she has the perception of the need to urinate.    Incontinence episodes:   0 episode per day Protection used heavy pads, 2 to 3 pull-ups daily Fluid intake #4 of cups of fluid per day,  #3 of cups of caffeine beverages per day and  # 0 alcohol intake per day.       Previous Therapy:     Patient has not responded to anticholinergics or Myrbetriq.  Blood pressure 120/48, pulse 80, resp. rate 16, height 5' (1.524 m), weight 229 lb 4.8 oz  (104.01 kg).  Patient did not have any contraindications present for PTNS, such as: pacemaker, implantable defibrillator, history of abnormal bleeding or a history of neuropathies or nerve damage.    I discussed with patient possible complications of procedure, such as discomfort, bleeding at insertion/stimulation site, procedure consent signed  Patient goals: Her goals are to not have accidents and to sleep through the night.    PTNS treatment: The needle electrode was inserted into the lower, inner aspect of the patient's left leg. The surface electrode was placed on the inside arch of the foot on the treatment leg. The lead set was connected to the stimulator, and the needle electrode clip was connected to the needle electrode. The stimulator that produces an adjustable electrical pulse that travels to sacral nerve plexus via the tibial nerve was increased to 6 and the patient received a sensory response.  Treatment Plan:  Patient tolerated the PTNS treatment for 30 minutes without difficulty. The electrode was removed from her leg without difficulty. She will return next week for her 6th PTNS treatment.

## 2015-07-13 ENCOUNTER — Ambulatory Visit (INDEPENDENT_AMBULATORY_CARE_PROVIDER_SITE_OTHER): Payer: Medicare Other | Admitting: Urology

## 2015-07-13 ENCOUNTER — Encounter: Payer: Self-pay | Admitting: Urology

## 2015-07-13 VITALS — BP 110/65 | HR 75 | Ht 60.0 in | Wt 228.1 lb

## 2015-07-13 DIAGNOSIS — N3946 Mixed incontinence: Secondary | ICD-10-CM

## 2015-07-13 LAB — PTNS-PERCUTANEOUS TIBIAL NERVE STIMULATION: Scan Result: 13

## 2015-07-13 NOTE — Progress Notes (Signed)
Chief Complaint:  Chief Complaint  Patient presents with  . Mixed Incontinence    PTNS     HPI: Patient is a 69 year old white female who presents today for her sixth PTNS treatment.    Background story Patient is a 69 year old white female with urinary incontinence who was previously managed by Dr. Jacqlyn Larsen, but she continue her treatment and [facility].  Patient's symptoms consist of frequent urination, urgency, nocturia 1, leakage of urine and trouble starting urinary stream. She wears 2 pull-ups at one time. She goes through 2-3 pull-ups a day. She has a constant dribble. She suffers from both stress and urge incontinence. She has not had recurrent urinary tract infections or gross hematuria. She has had these symptoms for years.  She had been on Myrbetriq 50 mg daily, but she is no longer finding it effective. She denies dysuria, hematuria or suprapubic pain.  Patient states her symptoms began several years ago and are not related to a specific event.  She states she continuously leaks and wear depends constantly.   Her leakage occurs during the day.  She loses urine when she coughs, sneezes, or laughs.  She also loses urine while walking, changes in position and when bending/lifting.  She is continuously leaking.    After her last treatment, she is experiencing daytime frequency of 8 visits (worsening) to the bathroom during the day, nighttime frequency of 2 times a night (worsening), she is not having pain or burning with urination and she has the perception of the need to urinate.    Incontinence episodes:   0 episode per day Protection used heavy pads, 2 to 3 pull-ups daily Fluid intake #4 of cups of fluid per day,  #3 of cups of caffeine beverages per day and  # 0 alcohol intake per day.       Previous Therapy:     Patient has not responded to anticholinergics or Myrbetriq.  Blood pressure 110/65, pulse 75, height 5' (1.524 m), weight 228 lb 1.6 oz (103.465  kg).  Patient did not have any contraindications present for PTNS, such as: pacemaker, implantable defibrillator, history of abnormal bleeding or a history of neuropathies or nerve damage.    I discussed with patient possible complications of procedure, such as discomfort, bleeding at insertion/stimulation site, procedure consent signed  Patient goals: Her goals are to not have accidents and to sleep through the night.    PTNS treatment: The needle electrode was inserted into the lower, inner aspect of the patient's right leg. The surface electrode was placed on the inside arch of the foot on the treatment leg. The lead set was connected to the stimulator, and the needle electrode clip was connected to the needle electrode. The stimulator that produces an adjustable electrical pulse that travels to sacral nerve plexus via the tibial nerve was increased to 13 and the patient received a sensory response.  Treatment Plan:  Patient tolerated the PTNS treatment for 30 minutes without difficulty. The electrode was removed from her leg without difficulty. She will return next week for her 7th PTNS treatment.

## 2015-07-19 DIAGNOSIS — E1142 Type 2 diabetes mellitus with diabetic polyneuropathy: Secondary | ICD-10-CM | POA: Diagnosis not present

## 2015-07-19 DIAGNOSIS — B351 Tinea unguium: Secondary | ICD-10-CM | POA: Diagnosis not present

## 2015-07-19 DIAGNOSIS — L600 Ingrowing nail: Secondary | ICD-10-CM | POA: Diagnosis not present

## 2015-07-20 ENCOUNTER — Encounter: Payer: Self-pay | Admitting: Urology

## 2015-07-20 ENCOUNTER — Ambulatory Visit (INDEPENDENT_AMBULATORY_CARE_PROVIDER_SITE_OTHER): Payer: Medicare Other | Admitting: Urology

## 2015-07-20 VITALS — BP 118/53 | HR 73 | Ht 60.0 in | Wt 228.6 lb

## 2015-07-20 DIAGNOSIS — N3946 Mixed incontinence: Secondary | ICD-10-CM

## 2015-07-20 LAB — PTNS-PERCUTANEOUS TIBIAL NERVE STIMULATION: SCAN RESULT: 2

## 2015-07-20 NOTE — Progress Notes (Signed)
Chief Complaint:  Chief Complaint  Patient presents with  . mixed Incontinence    PTNS     HPI: Patient is a 69 year old white female who presents today for her seventh PTNS treatment.    Background story Patient is a 69 year old white female with urinary incontinence who was previously managed by Dr. Jacqlyn Larsen, but she continue her treatment and [facility].  Patient's symptoms consist of frequent urination, urgency, nocturia 1, leakage of urine and trouble starting urinary stream. She wears 2 pull-ups at one time. She goes through 2-3 pull-ups a day. She has a constant dribble. She suffers from both stress and urge incontinence. She has not had recurrent urinary tract infections or gross hematuria. She has had these symptoms for years.  She had been on Myrbetriq 50 mg daily, but she is no longer finding it effective. She denies dysuria, hematuria or suprapubic pain.  Patient states her symptoms began several years ago and are not related to a specific event.  She states she continuously leaks and wear depends constantly.   Her leakage occurs during the day.  She loses urine when she coughs, sneezes, or laughs.  She also loses urine while walking, changes in position and when bending/lifting.  She is continuously leaking.    After her last treatment, she is experiencing daytime frequency of 7 visits (improving) to the bathroom during the day, nighttime frequency of 1 time a night (improving), she is not having pain or burning with urination and she has the perception of the need to urinate.    She is now at the halfway mark of her PTNS treatment. She has seen significant improvement in her urinary symptoms. When she started the treatment she was having incontinence all day long. She is now down to 0 episodes of incontinence. She would like to continue the PTNS  Incontinence episodes:   0 episode per day Protection used heavy pads, 2 to 3 pull-ups daily Fluid intake #4 of cups of  fluid per day,  #3 of cups of caffeine beverages per day and  # 0 alcohol intake per day.       Previous Therapy:     Patient has not responded to anticholinergics or Myrbetriq.  Blood pressure 118/53, pulse 73, height 5' (1.524 m), weight 228 lb 9.6 oz (103.692 kg).  Patient did not have any contraindications present for PTNS, such as: pacemaker, implantable defibrillator, history of abnormal bleeding or a history of neuropathies or nerve damage.    I discussed with patient possible complications of procedure, such as discomfort, bleeding at insertion/stimulation site, procedure consent signed  Patient goals: Her goals are to not have accidents and to sleep through the night.    PTNS treatment: The needle electrode was inserted into the lower, inner aspect of the patient's right leg. The surface electrode was placed on the inside arch of the foot on the treatment leg. The lead set was connected to the stimulator, and the needle electrode clip was connected to the needle electrode. The stimulator that produces an adjustable electrical pulse that travels to sacral nerve plexus via the tibial nerve was increased to 2 and the patient received a sensory response and a toe flex.    Treatment Plan:  Patient tolerated the PTNS treatment for 30 minutes without difficulty. The electrode was removed from her leg without difficulty. She will return next week for her 8th PTNS treatment.

## 2015-07-27 ENCOUNTER — Ambulatory Visit: Payer: Medicare Other | Admitting: Obstetrics and Gynecology

## 2015-07-28 DIAGNOSIS — R0681 Apnea, not elsewhere classified: Secondary | ICD-10-CM | POA: Insufficient documentation

## 2015-07-28 DIAGNOSIS — R0602 Shortness of breath: Secondary | ICD-10-CM | POA: Diagnosis not present

## 2015-07-28 DIAGNOSIS — E7800 Pure hypercholesterolemia, unspecified: Secondary | ICD-10-CM | POA: Diagnosis not present

## 2015-07-28 DIAGNOSIS — G4733 Obstructive sleep apnea (adult) (pediatric): Secondary | ICD-10-CM | POA: Diagnosis not present

## 2015-07-28 DIAGNOSIS — I340 Nonrheumatic mitral (valve) insufficiency: Secondary | ICD-10-CM | POA: Diagnosis not present

## 2015-08-03 ENCOUNTER — Ambulatory Visit (INDEPENDENT_AMBULATORY_CARE_PROVIDER_SITE_OTHER): Payer: Medicare Other | Admitting: Urology

## 2015-08-03 ENCOUNTER — Encounter: Payer: Self-pay | Admitting: Urology

## 2015-08-03 VITALS — BP 124/68 | HR 87 | Ht 59.0 in | Wt 230.0 lb

## 2015-08-03 DIAGNOSIS — N3946 Mixed incontinence: Secondary | ICD-10-CM

## 2015-08-03 LAB — PTNS-PERCUTANEOUS TIBIAL NERVE STIMULATION

## 2015-08-03 NOTE — Progress Notes (Signed)
Chief Complaint:  Chief Complaint  Patient presents with  . PTNS     HPI: Patient is a 70 year old white female who presents today for her eight PTNS treatment.    Background story Patient is a 70 year old white female with urinary incontinence who was previously managed by Dr. Jacqlyn Larsen, but she continue her treatment and [facility].  Patient's symptoms consist of frequent urination, urgency, nocturia 1, leakage of urine and trouble starting urinary stream. She wears 2 pull-ups at one time. She goes through 2-3 pull-ups a day. She has a constant dribble. She suffers from both stress and urge incontinence. She has not had recurrent urinary tract infections or gross hematuria. She has had these symptoms for years.  She had been on Myrbetriq 50 mg daily, but she is no longer finding it effective. She denies dysuria, hematuria or suprapubic pain.  Patient states her symptoms began several years ago and are not related to a specific event.  She states she continuously leaks and wear depends constantly.   Her leakage occurs during the day.  She loses urine when she coughs, sneezes, or laughs.  She also loses urine while walking, changes in position and when bending/lifting.  She is continuously leaking.    After her last treatment, she is experiencing daytime frequency of 7 visits (stable) to the bathroom during the day, nighttime frequency of 1 time a night (stable), she is not having pain or burning with urination and she has the perception of the need to urinate.    Incontinence episodes:   2 episode per day (worse) Protection used heavy pads, 2 to 3 pull-ups daily Fluid intake #4 of cups of fluid per day,  #3 of cups of caffeine beverages per day and  # 0 alcohol intake per day.       Previous Therapy:     Patient has not responded to anticholinergics or Myrbetriq.  Blood pressure 124/68, pulse 87, height 4\' 11"  (1.499 m), weight 230 lb (104.327 kg).  Patient did not have any  contraindications present for PTNS, such as: pacemaker, implantable defibrillator, history of abnormal bleeding or a history of neuropathies or nerve damage.    I discussed with patient possible complications of procedure, such as discomfort, bleeding at insertion/stimulation site, procedure consent signed  Patient goals: Her goals are to not have accidents and to sleep through the night.    PTNS treatment: The needle electrode was inserted into the lower, inner aspect of the patient's right leg. The surface electrode was placed on the inside arch of the foot on the treatment leg. The lead set was connected to the stimulator, and the needle electrode clip was connected to the needle electrode. The stimulator that produces an adjustable electrical pulse that travels to sacral nerve plexus via the tibial nerve was increased to 7 and the patient received a sensory response and a toe flex.    Treatment Plan:  Patient tolerated the PTNS treatment for 30 minutes without difficulty. The electrode was removed from her leg without difficulty. She will return next week for her 9th PTNS treatment.

## 2015-08-10 ENCOUNTER — Encounter: Payer: Self-pay | Admitting: Urology

## 2015-08-10 ENCOUNTER — Ambulatory Visit (INDEPENDENT_AMBULATORY_CARE_PROVIDER_SITE_OTHER): Payer: Medicare Other | Admitting: Urology

## 2015-08-10 VITALS — BP 113/64 | HR 71 | Ht 60.0 in | Wt 230.0 lb

## 2015-08-10 DIAGNOSIS — N3946 Mixed incontinence: Secondary | ICD-10-CM

## 2015-08-10 LAB — PTNS-PERCUTANEOUS TIBIAL NERVE STIMULATION: Scan Result: 9

## 2015-08-10 NOTE — Progress Notes (Signed)
Chief Complaint:  Chief Complaint  Patient presents with  . Mixed INcontinence    PTNS     HPI: Patient is a 70 year old white female who presents today for her ninth PTNS treatment.    Background story Patient is a 70 year old white female with urinary incontinence who was previously managed by Dr. Jacqlyn Larsen, but she continue her treatment and [facility].  Patient's symptoms consist of frequent urination, urgency, nocturia 1, leakage of urine and trouble starting urinary stream. She wears 2 pull-ups at one time. She goes through 2-3 pull-ups a day. She has a constant dribble. She suffers from both stress and urge incontinence. She has not had recurrent urinary tract infections or gross hematuria. She has had these symptoms for years.  She had been on Myrbetriq 50 mg daily, but she is no longer finding it effective. She denies dysuria, hematuria or suprapubic pain.  Patient states her symptoms began several years ago and are not related to a specific event.  She states she continuously leaks and wear depends constantly.   Her leakage occurs during the day.  She loses urine when she coughs, sneezes, or laughs.  She also loses urine while walking, changes in position and when bending/lifting.  She is continuously leaking.    After her last treatment, she is experiencing daytime frequency of 11 visits (worse) to the bathroom during the day, nighttime frequency of 2 time a night (worse), she is not having pain or burning with urination and she has the perception of the need to urinate.    Incontinence episodes:   2 episode per day (worse) Protection used heavy pads, 2 to 3 pull-ups daily Fluid intake #4 of cups of fluid per day,  #3 of cups of caffeine beverages per day and  # 0 alcohol intake per day.       Previous Therapy:     Patient has not responded to anticholinergics or Myrbetriq.  Blood pressure 113/64, pulse 71, height 5' (1.524 m), weight 230 lb (104.327 kg).  Patient  did not have any contraindications present for PTNS, such as: pacemaker, implantable defibrillator, history of abnormal bleeding or a history of neuropathies or nerve damage.    I discussed with patient possible complications of procedure, such as discomfort, bleeding at insertion/stimulation site, procedure consent signed  Patient goals: Her goals are to not have accidents and to sleep through the night.    PTNS treatment: The needle electrode was inserted into the lower, inner aspect of the patient's right leg. The surface electrode was placed on the inside arch of the foot on the treatment leg. The lead set was connected to the stimulator, and the needle electrode clip was connected to the needle electrode. The stimulator that produces an adjustable electrical pulse that travels to sacral nerve plexus via the tibial nerve was increased to 9 and the patient received a sensory response and a toe flex.    Treatment Plan:  Patient tolerated the PTNS treatment for 30 minutes without difficulty. The electrode was removed from her leg without difficulty. She will return next week for her 10th PTNS treatment.

## 2015-08-17 ENCOUNTER — Encounter: Payer: Self-pay | Admitting: Urology

## 2015-08-17 ENCOUNTER — Ambulatory Visit (INDEPENDENT_AMBULATORY_CARE_PROVIDER_SITE_OTHER): Payer: Medicare Other | Admitting: Urology

## 2015-08-17 VITALS — BP 120/57 | HR 83 | Ht 60.0 in | Wt 223.9 lb

## 2015-08-17 DIAGNOSIS — N3946 Mixed incontinence: Secondary | ICD-10-CM

## 2015-08-17 LAB — PTNS-PERCUTANEOUS TIBIAL NERVE STIMULATION: SCAN RESULT: 14

## 2015-08-17 NOTE — Progress Notes (Signed)
Chief Complaint:  Chief Complaint  Patient presents with  . Mixed Incontinence    PTNS     HPI: Patient is a 70 year old white female who presents today for her tenth PTNS treatment.    Background story Patient is a 70 year old white female with urinary incontinence who was previously managed by Dr. Jacqlyn Larsen, but she continue her treatment and [facility].  Patient's symptoms consist of frequent urination, urgency, nocturia 1, leakage of urine and trouble starting urinary stream. She wears 2 pull-ups at one time. She goes through 2-3 pull-ups a day. She has a constant dribble. She suffers from both stress and urge incontinence. She has not had recurrent urinary tract infections or gross hematuria. She has had these symptoms for years.  She had been on Myrbetriq 50 mg daily, but she is no longer finding it effective. She denies dysuria, hematuria or suprapubic pain.  Patient states her symptoms began several years ago and are not related to a specific event.  She states she continuously leaks and wear depends constantly.   Her leakage occurs during the day.  She loses urine when she coughs, sneezes, or laughs.  She also loses urine while walking, changes in position and when bending/lifting.  She is continuously leaking.    After her last treatment, she is experiencing daytime frequency of 8 visits (improvement) to the bathroom during the day, nighttime frequency of 1 time a night (improvement), she is not having pain or burning with urination and she has the perception of the need to urinate.  She is having 2 episodes of incontinence (stable).    Incontinence episodes:   2 episode per day (worse) Protection used heavy pads, 2 to 3 pull-ups daily Fluid intake #4 of cups of fluid per day,  #3 of cups of caffeine beverages per day and  # 0 alcohol intake per day.       Previous Therapy:     Patient has not responded to anticholinergics or Myrbetriq.  Blood pressure 120/57, pulse  83, height 5' (1.524 m), weight 223 lb 14.4 oz (101.56 kg).  Patient did not have any contraindications present for PTNS, such as: pacemaker, implantable defibrillator, history of abnormal bleeding or a history of neuropathies or nerve damage.    I discussed with patient possible complications of procedure, such as discomfort, bleeding at insertion/stimulation site, procedure consent signed  Patient goals: Her goals are to not have accidents and to sleep through the night.    PTNS treatment: The needle electrode was inserted into the lower, inner aspect of the patient's right leg. The surface electrode was placed on the inside arch of the foot on the treatment leg. The lead set was connected to the stimulator, and the needle electrode clip was connected to the needle electrode. The stimulator that produces an adjustable electrical pulse that travels to sacral nerve plexus via the tibial nerve was increased to 14 and the patient received a sensory response and a toe flex.    Treatment Plan:  Patient tolerated the PTNS treatment for 30 minutes without difficulty. The electrode was removed from her leg without difficulty. She will return next week for her 11th PTNS treatment.

## 2015-08-24 ENCOUNTER — Encounter: Payer: Self-pay | Admitting: Urology

## 2015-08-24 ENCOUNTER — Ambulatory Visit (INDEPENDENT_AMBULATORY_CARE_PROVIDER_SITE_OTHER): Payer: Medicare Other | Admitting: Urology

## 2015-08-24 VITALS — BP 115/67 | HR 78 | Ht 60.0 in | Wt 227.9 lb

## 2015-08-24 DIAGNOSIS — R32 Unspecified urinary incontinence: Secondary | ICD-10-CM | POA: Diagnosis not present

## 2015-08-24 LAB — PTNS-PERCUTANEOUS TIBIAL NERVE STIMULATION: SCAN RESULT: 6

## 2015-08-25 NOTE — Progress Notes (Signed)
Chief Complaint:  Chief Complaint  Patient presents with  . Mixed Incontinence    PTNS     HPI: Patient is a 70 year old white female who presents today for her tenth PTNS treatment.    Background story Patient is a 70 year old white female with urinary incontinence who was previously managed by Dr. Jacqlyn Larsen, but she continue her treatment and [facility].  Patient's symptoms consist of frequent urination, urgency, nocturia 1, leakage of urine and trouble starting urinary stream. She wears 2 pull-ups at one time. She goes through 2-3 pull-ups a day. She has a constant dribble. She suffers from both stress and urge incontinence. She has not had recurrent urinary tract infections or gross hematuria. She has had these symptoms for years.  She had been on Myrbetriq 50 mg daily, but she is no longer finding it effective. She denies dysuria, hematuria or suprapubic pain.  Patient states her symptoms began several years ago and are not related to a specific event.  She states she continuously leaks and wear depends constantly.   Her leakage occurs during the day.  She loses urine when she coughs, sneezes, or laughs.  She also loses urine while walking, changes in position and when bending/lifting.  She is continuously leaking.    After her last treatment, she is experiencing daytime frequency of 8 visits (stable) to the bathroom during the day, nighttime frequency of 1-2  time a night (stable), she is not having pain or burning with urination and she has the perception of the need to urinate.  She is having 2 episodes of incontinence (stable).    Incontinence episodes:   2 episode per day (worse) Protection used heavy pads, 2 to 3 pull-ups daily Fluid intake #4 of cups of fluid per day,  #3 of cups of caffeine beverages per day and  # 0 alcohol intake per day.       Previous Therapy:     Patient has not responded to anticholinergics or Myrbetriq.  Blood pressure 120/57, pulse 83,  height 5' (1.524 m), weight 223 lb 14.4 oz (101.56 kg).  Patient did not have any contraindications present for PTNS, such as: pacemaker, implantable defibrillator, history of abnormal bleeding or a history of neuropathies or nerve damage.    I discussed with patient possible complications of procedure, such as discomfort, bleeding at insertion/stimulation site, procedure consent signed  Patient goals: Her goals are to not have accidents and to sleep through the night.    PTNS treatment: The needle electrode was inserted into the lower, inner aspect of the patient's right leg. The surface electrode was placed on the inside arch of the foot on the treatment leg. The lead set was connected to the stimulator, and the needle electrode clip was connected to the needle electrode. The stimulator that produces an adjustable electrical pulse that travels to sacral nerve plexus via the tibial nerve was increased to 6 and the patient received a sensory response and a toe flex.    Treatment Plan:  Patient tolerated the PTNS treatment for 30 minutes without difficulty. The electrode was removed from her leg without difficulty. She will return next week for her 12th PTNS treatment.

## 2015-08-31 ENCOUNTER — Ambulatory Visit: Payer: Medicare Other | Admitting: Obstetrics and Gynecology

## 2015-09-07 DIAGNOSIS — G4733 Obstructive sleep apnea (adult) (pediatric): Secondary | ICD-10-CM | POA: Diagnosis not present

## 2015-09-21 ENCOUNTER — Ambulatory Visit (INDEPENDENT_AMBULATORY_CARE_PROVIDER_SITE_OTHER): Payer: Medicare Other | Admitting: Urology

## 2015-09-21 ENCOUNTER — Encounter: Payer: Self-pay | Admitting: Urology

## 2015-09-21 VITALS — BP 108/56 | HR 84 | Ht 60.0 in | Wt 229.3 lb

## 2015-09-21 DIAGNOSIS — N3946 Mixed incontinence: Secondary | ICD-10-CM | POA: Diagnosis not present

## 2015-09-21 LAB — PTNS-PERCUTANEOUS TIBIAL NERVE STIMULATION: SCAN RESULT: 18

## 2015-09-23 NOTE — Progress Notes (Signed)
Chief Complaint:  Chief Complaint  Patient presents with  . Mixed Incontinence    PTNS     HPI: Patient is a 70 year old white female who presents today for her maintenance PTNS treatment.    Background story Patient is a 70 year old white female with urinary incontinence who was previously managed by Dr. Jacqlyn Larsen, but she continue her treatment and [facility].  Patient's symptoms consist of frequent urination, urgency, nocturia 1, leakage of urine and trouble starting urinary stream. She wears 2 pull-ups at one time. She goes through 2-3 pull-ups a day. She has a constant dribble. She suffers from both stress and urge incontinence. She has not had recurrent urinary tract infections or gross hematuria. She has had these symptoms for years.  She had been on Myrbetriq 50 mg daily, but she is no longer finding it effective. She denies dysuria, hematuria or suprapubic pain.  Patient states her symptoms began several years ago and are not related to a specific event.  She states she continuously leaks and wear depends constantly.   Her leakage occurs during the day.  She loses urine when she coughs, sneezes, or laughs.  She also loses urine while walking, changes in position and when bending/lifting.  She is continuously leaking.    After her last treatment, she is experiencing daytime frequency of 8 visits (stable) to the bathroom during the day, nighttime frequency of 1-2  time a night (stable), she is not having pain or burning with urination and she has the perception of the need to urinate.  She is having 2 episodes of incontinence (stable).    Incontinence episodes:   2 episode per day (worse) Protection used heavy pads, 2 to 3 pull-ups daily Fluid intake #4 of cups of fluid per day,  #3 of cups of caffeine beverages per day and  # 0 alcohol intake per day.       Previous Therapy:     Patient has not responded to anticholinergics or Myrbetriq.  Blood pressure 120/57, pulse  83, height 5' (1.524 m), weight 223 lb 14.4 oz (101.56 kg).  Patient did not have any contraindications present for PTNS, such as: pacemaker, implantable defibrillator, history of abnormal bleeding or a history of neuropathies or nerve damage.    I discussed with patient possible complications of procedure, such as discomfort, bleeding at insertion/stimulation site, procedure consent signed  Patient goals: Her goals are to not have accidents and to sleep through the night.    PTNS treatment: The needle electrode was inserted into the lower, inner aspect of the patient's right leg. The surface electrode was placed on the inside arch of the foot on the treatment leg. The lead set was connected to the stimulator, and the needle electrode clip was connected to the needle electrode. The stimulator that produces an adjustable electrical pulse that travels to sacral nerve plexus via the tibial nerve was increased to 18 and the patient received a sensory response and a toe flex.    Treatment Plan:  Patient tolerated the PTNS treatment for 30 minutes without difficulty. The electrode was removed from her leg without difficulty. She will return next week for her maintenance PTNS treatment.

## 2015-10-11 DIAGNOSIS — G4733 Obstructive sleep apnea (adult) (pediatric): Secondary | ICD-10-CM | POA: Diagnosis not present

## 2015-10-19 ENCOUNTER — Ambulatory Visit (INDEPENDENT_AMBULATORY_CARE_PROVIDER_SITE_OTHER): Payer: Medicare Other | Admitting: Urology

## 2015-10-19 ENCOUNTER — Encounter: Payer: Self-pay | Admitting: Urology

## 2015-10-19 ENCOUNTER — Ambulatory Visit: Payer: Medicare Other | Admitting: Urology

## 2015-10-19 VITALS — BP 109/60 | HR 81 | Ht 62.0 in | Wt 234.1 lb

## 2015-10-19 DIAGNOSIS — N3946 Mixed incontinence: Secondary | ICD-10-CM | POA: Diagnosis not present

## 2015-10-19 LAB — PTNS-PERCUTANEOUS TIBIAL NERVE STIMULATION: SCAN RESULT: 2

## 2015-10-19 NOTE — Progress Notes (Signed)
Chief Complaint:  Chief Complaint  Patient presents with  . Mixed Incontinence    PTNS     HPI: Patient is a 70 year old white female who presents today for her maintenance PTNS treatment.    Background story Patient is a 70 year old white female with urinary incontinence who was previously managed by Dr. Jacqlyn Larsen, but she continue her treatment and [facility].  Patient's symptoms consist of frequent urination, urgency, nocturia 1, leakage of urine and trouble starting urinary stream. She wears 2 pull-ups at one time. She goes through 2-3 pull-ups a day. She has a constant dribble. She suffers from both stress and urge incontinence. She has not had recurrent urinary tract infections or gross hematuria. She has had these symptoms for years.  She had been on Myrbetriq 50 mg daily, but she is no longer finding it effective. She denies dysuria, hematuria or suprapubic pain.  Patient states her symptoms began several years ago and are not related to a specific event.  She states she continuously leaks and wear depends constantly.   Her leakage occurs during the day.  She loses urine when she coughs, sneezes, or laughs.  She also loses urine while walking, changes in position and when bending/lifting.  She is continuously leaking.    After her last treatment, she is experiencing daytime frequency of 8 visits (stable) to the bathroom during the day, nighttime frequency of 4 time a night (worsening), she is not having pain or burning with urination and she has the perception of the need to urinate.  She is having 2 episodes of incontinence (stable).    Incontinence episodes:   2 episode per day (worse) Protection used heavy pads, 2 to 3 pull-ups daily Fluid intake #4 of cups of fluid per day,  #3 of cups of caffeine beverages per day and  # 0 alcohol intake per day.       Previous Therapy:     Patient has not responded to anticholinergics or Myrbetriq.  Blood pressure 109/60, pulse  81, height 5\' 2"  (1.575 m), weight 234 lb 1.6 oz (106.187 kg).  Patient did not have any contraindications present for PTNS, such as: pacemaker, implantable defibrillator, history of abnormal bleeding or a history of neuropathies or nerve damage.    I discussed with patient possible complications of procedure, such as discomfort, bleeding at insertion/stimulation site, procedure consent signed  Patient goals: Her goals are to not have accidents and to sleep through the night.    PTNS treatment: The needle electrode was inserted into the lower, inner aspect of the patient's right leg. The surface electrode was placed on the inside arch of the foot on the treatment leg. The lead set was connected to the stimulator, and the needle electrode clip was connected to the needle electrode. The stimulator that produces an adjustable electrical pulse that travels to sacral nerve plexus via the tibial nerve was increased to 2 and the patient received a sensory response.    Treatment Plan:  Patient tolerated the PTNS treatment for 30 minutes without difficulty. The electrode was removed from her leg without difficulty. She will return next month for her maintenance PTNS treatment.

## 2015-10-25 DIAGNOSIS — L851 Acquired keratosis [keratoderma] palmaris et plantaris: Secondary | ICD-10-CM | POA: Diagnosis not present

## 2015-10-25 DIAGNOSIS — E1142 Type 2 diabetes mellitus with diabetic polyneuropathy: Secondary | ICD-10-CM | POA: Diagnosis not present

## 2015-10-25 DIAGNOSIS — B351 Tinea unguium: Secondary | ICD-10-CM | POA: Diagnosis not present

## 2015-11-16 DIAGNOSIS — G4733 Obstructive sleep apnea (adult) (pediatric): Secondary | ICD-10-CM | POA: Diagnosis not present

## 2015-11-16 DIAGNOSIS — R262 Difficulty in walking, not elsewhere classified: Secondary | ICD-10-CM | POA: Diagnosis not present

## 2015-11-16 DIAGNOSIS — R51 Headache: Secondary | ICD-10-CM | POA: Diagnosis not present

## 2015-11-16 DIAGNOSIS — R413 Other amnesia: Secondary | ICD-10-CM | POA: Diagnosis not present

## 2015-11-16 DIAGNOSIS — Z9989 Dependence on other enabling machines and devices: Secondary | ICD-10-CM | POA: Diagnosis not present

## 2015-11-17 DIAGNOSIS — M170 Bilateral primary osteoarthritis of knee: Secondary | ICD-10-CM | POA: Diagnosis not present

## 2015-11-17 DIAGNOSIS — G629 Polyneuropathy, unspecified: Secondary | ICD-10-CM | POA: Diagnosis not present

## 2015-11-17 DIAGNOSIS — D638 Anemia in other chronic diseases classified elsewhere: Secondary | ICD-10-CM | POA: Diagnosis not present

## 2015-11-17 DIAGNOSIS — G4733 Obstructive sleep apnea (adult) (pediatric): Secondary | ICD-10-CM | POA: Diagnosis not present

## 2015-11-17 DIAGNOSIS — R296 Repeated falls: Secondary | ICD-10-CM | POA: Diagnosis not present

## 2015-11-17 DIAGNOSIS — Z9989 Dependence on other enabling machines and devices: Secondary | ICD-10-CM | POA: Diagnosis not present

## 2015-11-17 DIAGNOSIS — H9041 Sensorineural hearing loss, unilateral, right ear, with unrestricted hearing on the contralateral side: Secondary | ICD-10-CM | POA: Diagnosis not present

## 2015-11-17 DIAGNOSIS — F419 Anxiety disorder, unspecified: Secondary | ICD-10-CM | POA: Diagnosis not present

## 2015-11-17 DIAGNOSIS — R413 Other amnesia: Secondary | ICD-10-CM | POA: Diagnosis not present

## 2015-11-17 DIAGNOSIS — E559 Vitamin D deficiency, unspecified: Secondary | ICD-10-CM | POA: Diagnosis not present

## 2015-11-23 ENCOUNTER — Ambulatory Visit (INDEPENDENT_AMBULATORY_CARE_PROVIDER_SITE_OTHER): Payer: Medicare Other | Admitting: Urology

## 2015-11-23 ENCOUNTER — Encounter: Payer: Self-pay | Admitting: Urology

## 2015-11-23 VITALS — BP 123/67 | HR 82 | Ht 62.0 in | Wt 237.9 lb

## 2015-11-23 DIAGNOSIS — R32 Unspecified urinary incontinence: Secondary | ICD-10-CM

## 2015-11-23 LAB — PTNS-PERCUTANEOUS TIBIAL NERVE STIMULATION: Scan Result: 17

## 2015-11-23 NOTE — Progress Notes (Signed)
Chief Complaint:  Chief Complaint  Patient presents with  . Mixed Incontinnence    PTNS     HPI: Patient is a 70 year old white female who presents today for her maintenance PTNS treatment.    Background story Patient is a 70 year old white female with urinary incontinence who was previously managed by Dr. Jacqlyn Larsen, but she continue her treatment and [facility].  Patient's symptoms consist of frequent urination, urgency, nocturia 1, leakage of urine and trouble starting urinary stream. She wears 2 pull-ups at one time. She goes through 2-3 pull-ups a day. She has a constant dribble. She suffers from both stress and urge incontinence. She has not had recurrent urinary tract infections or gross hematuria. She has had these symptoms for years.  She had been on Myrbetriq 50 mg daily, but she is no longer finding it effective. She denies dysuria, hematuria or suprapubic pain.  Patient states her symptoms began several years ago and are not related to a specific event.  She states she continuously leaks and wear depends constantly.   Her leakage occurs during the day.  She loses urine when she coughs, sneezes, or laughs.  She also loses urine while walking, changes in position and when bending/lifting.  She is continuously leaking.    After her last treatment, she is experiencing daytime frequency of 8 visits (stable) to the bathroom during the day, nighttime frequency of 2 time a night (improved), she is not having pain or burning with urination and she has the perception of the need to urinate.  She is dribbling all day long (worsening).    Incontinence episodes:   2 episode per day (worse) Protection used heavy pads, 2 to 3 pull-ups daily Fluid intake #4 of cups of fluid per day,  #3 of cups of caffeine beverages per day and  # 0 alcohol intake per day.       Previous Therapy: Patient has not responded to anticholinergics or Myrbetriq.  Blood pressure 123/67, pulse 82, height 5'  2" (1.575 m), weight 237 lb 14.4 oz (107.911 kg).  Patient did not have any contraindications present for PTNS, such as: pacemaker, implantable defibrillator, history of abnormal bleeding or a history of neuropathies or nerve damage.    I discussed with patient possible complications of procedure, such as discomfort, bleeding at insertion/stimulation site, procedure consent signed  Patient goals: Her goals are to not have accidents and to sleep through the night.    PTNS treatment: The needle electrode was inserted into the lower, inner aspect of the patient's left leg. The surface electrode was placed on the inside arch of the foot on the treatment leg. The lead set was connected to the stimulator, and the needle electrode clip was connected to the needle electrode. The stimulator that produces an adjustable electrical pulse that travels to sacral nerve plexus via the tibial nerve was increased to 17 and the patient received a sensory response.    Treatment Plan:  Patient tolerated the PTNS treatment for 30 minutes without difficulty. The electrode was removed from her leg without difficulty. She will return next month for her maintenance PTNS treatment.

## 2015-12-22 DIAGNOSIS — E039 Hypothyroidism, unspecified: Secondary | ICD-10-CM | POA: Diagnosis not present

## 2015-12-22 DIAGNOSIS — M1712 Unilateral primary osteoarthritis, left knee: Secondary | ICD-10-CM | POA: Diagnosis not present

## 2015-12-22 DIAGNOSIS — E7800 Pure hypercholesterolemia, unspecified: Secondary | ICD-10-CM | POA: Diagnosis not present

## 2015-12-22 DIAGNOSIS — G8929 Other chronic pain: Secondary | ICD-10-CM | POA: Diagnosis not present

## 2015-12-22 DIAGNOSIS — R739 Hyperglycemia, unspecified: Secondary | ICD-10-CM | POA: Diagnosis not present

## 2015-12-22 DIAGNOSIS — M25562 Pain in left knee: Secondary | ICD-10-CM | POA: Diagnosis not present

## 2015-12-28 ENCOUNTER — Encounter: Payer: Self-pay | Admitting: Urology

## 2015-12-28 ENCOUNTER — Ambulatory Visit (INDEPENDENT_AMBULATORY_CARE_PROVIDER_SITE_OTHER): Payer: Medicare Other | Admitting: Urology

## 2015-12-28 VITALS — BP 95/55 | HR 99 | Ht 60.0 in

## 2015-12-28 DIAGNOSIS — R32 Unspecified urinary incontinence: Secondary | ICD-10-CM | POA: Diagnosis not present

## 2015-12-28 LAB — PTNS-PERCUTANEOUS TIBIAL NERVE STIMULATION: SCAN RESULT: 5

## 2015-12-28 NOTE — Progress Notes (Signed)
Chief Complaint:  Chief Complaint  Patient presents with  . Urinary Incontinence    PTNS     HPI: Patient is a 70 year old white female who presents today for her maintenance PTNS treatment.    Background story Patient is a 70 year old white female with urinary incontinence who was previously managed by Dr. Jacqlyn Larsen, but she continue her treatment and [facility].  Patient's symptoms consist of frequent urination, urgency, nocturia 1, leakage of urine and trouble starting urinary stream. She wears 2 pull-ups at one time. She goes through 2-3 pull-ups a day. She has a constant dribble. She suffers from both stress and urge incontinence. She has not had recurrent urinary tract infections or gross hematuria. She has had these symptoms for years.  She had been on Myrbetriq 50 mg daily, but she is no longer finding it effective. She denies dysuria, hematuria or suprapubic pain.  Patient states her symptoms began several years ago and are not related to a specific event.  She states she continuously leaks and wear depends constantly.   Her leakage occurs during the day.  She loses urine when she coughs, sneezes, or laughs.  She also loses urine while walking, changes in position and when bending/lifting.  She is continuously leaking.    After her last treatment, she is experiencing daytime frequency of 8 visits (stable) to the bathroom during the day, nighttime frequency of 2 time a night (improved), she is not having pain or burning with urination and she has the perception of the need to urinate.  She is dribbling all day long (unchanged).  She is frustrated at today's visit because she felt her last treatment did not make a difference.  We briefly discussed other treatment modalities such as Botox or InterStim placement, but she declined at this time. She stated that she will continue the maintenance PTNS.  Incontinence episodes:   2 episode per day (worse) Protection used heavy pads, 2  to 3 pull-ups daily Fluid intake #4 of cups of fluid per day,  #3 of cups of caffeine beverages per day and  # 0 alcohol intake per day.       Previous Therapy: Patient has not responded to anticholinergics or Myrbetriq.  Blood pressure 95/55, pulse 99, height 5' (1.524 m).  Patient did not have any contraindications present for PTNS, such as: pacemaker, implantable defibrillator, history of abnormal bleeding or a history of neuropathies or nerve damage.    I discussed with patient possible complications of procedure, such as discomfort, bleeding at insertion/stimulation site, procedure consent signed  Patient goals: Her goals are to not have accidents and to sleep through the night.    PTNS treatment: The needle electrode was inserted into the lower, inner aspect of the patient's right leg. The surface electrode was placed on the inside arch of the foot on the treatment leg. The lead set was connected to the stimulator, and the needle electrode clip was connected to the needle electrode. The stimulator that produces an adjustable electrical pulse that travels to sacral nerve plexus via the tibial nerve was increased to 5 and the patient received a sensory response and a toe flex.      Treatment Plan:  Patient tolerated the PTNS treatment for 30 minutes without difficulty. The electrode was removed from her leg without difficulty. She will return next month for her maintenance PTNS treatment.

## 2016-01-04 DIAGNOSIS — G4733 Obstructive sleep apnea (adult) (pediatric): Secondary | ICD-10-CM | POA: Diagnosis not present

## 2016-01-16 DIAGNOSIS — M1712 Unilateral primary osteoarthritis, left knee: Secondary | ICD-10-CM | POA: Diagnosis not present

## 2016-01-16 DIAGNOSIS — M23204 Derangement of unspecified medial meniscus due to old tear or injury, left knee: Secondary | ICD-10-CM | POA: Diagnosis not present

## 2016-01-17 DIAGNOSIS — R079 Chest pain, unspecified: Secondary | ICD-10-CM | POA: Diagnosis not present

## 2016-01-17 DIAGNOSIS — Z9989 Dependence on other enabling machines and devices: Secondary | ICD-10-CM | POA: Diagnosis not present

## 2016-01-17 DIAGNOSIS — K219 Gastro-esophageal reflux disease without esophagitis: Secondary | ICD-10-CM | POA: Diagnosis not present

## 2016-01-17 DIAGNOSIS — R0602 Shortness of breath: Secondary | ICD-10-CM | POA: Diagnosis not present

## 2016-01-17 DIAGNOSIS — R413 Other amnesia: Secondary | ICD-10-CM | POA: Diagnosis not present

## 2016-01-17 DIAGNOSIS — G4733 Obstructive sleep apnea (adult) (pediatric): Secondary | ICD-10-CM | POA: Diagnosis not present

## 2016-01-17 DIAGNOSIS — I340 Nonrheumatic mitral (valve) insufficiency: Secondary | ICD-10-CM | POA: Diagnosis not present

## 2016-01-19 DIAGNOSIS — Z79899 Other long term (current) drug therapy: Secondary | ICD-10-CM | POA: Diagnosis not present

## 2016-01-19 DIAGNOSIS — F250 Schizoaffective disorder, bipolar type: Secondary | ICD-10-CM | POA: Diagnosis not present

## 2016-01-26 ENCOUNTER — Ambulatory Visit: Payer: Medicare Other | Admitting: Urology

## 2016-01-30 DIAGNOSIS — E1142 Type 2 diabetes mellitus with diabetic polyneuropathy: Secondary | ICD-10-CM | POA: Diagnosis not present

## 2016-01-30 DIAGNOSIS — B351 Tinea unguium: Secondary | ICD-10-CM | POA: Diagnosis not present

## 2016-01-30 DIAGNOSIS — L851 Acquired keratosis [keratoderma] palmaris et plantaris: Secondary | ICD-10-CM | POA: Diagnosis not present

## 2016-02-13 DIAGNOSIS — M23204 Derangement of unspecified medial meniscus due to old tear or injury, left knee: Secondary | ICD-10-CM | POA: Diagnosis not present

## 2016-02-13 DIAGNOSIS — M1712 Unilateral primary osteoarthritis, left knee: Secondary | ICD-10-CM | POA: Diagnosis not present

## 2016-02-20 DIAGNOSIS — M1712 Unilateral primary osteoarthritis, left knee: Secondary | ICD-10-CM | POA: Diagnosis not present

## 2016-02-20 DIAGNOSIS — G8929 Other chronic pain: Secondary | ICD-10-CM | POA: Diagnosis not present

## 2016-02-20 DIAGNOSIS — M25562 Pain in left knee: Secondary | ICD-10-CM | POA: Diagnosis not present

## 2016-02-27 DIAGNOSIS — M25562 Pain in left knee: Secondary | ICD-10-CM | POA: Diagnosis not present

## 2016-02-27 DIAGNOSIS — M1712 Unilateral primary osteoarthritis, left knee: Secondary | ICD-10-CM | POA: Diagnosis not present

## 2016-02-27 DIAGNOSIS — G8929 Other chronic pain: Secondary | ICD-10-CM | POA: Diagnosis not present

## 2016-03-08 DIAGNOSIS — F251 Schizoaffective disorder, depressive type: Secondary | ICD-10-CM | POA: Diagnosis not present

## 2016-03-15 DIAGNOSIS — F250 Schizoaffective disorder, bipolar type: Secondary | ICD-10-CM | POA: Diagnosis not present

## 2016-03-19 DIAGNOSIS — Z79899 Other long term (current) drug therapy: Secondary | ICD-10-CM | POA: Diagnosis not present

## 2016-03-22 DIAGNOSIS — F250 Schizoaffective disorder, bipolar type: Secondary | ICD-10-CM | POA: Diagnosis not present

## 2016-04-12 DIAGNOSIS — J301 Allergic rhinitis due to pollen: Secondary | ICD-10-CM | POA: Diagnosis not present

## 2016-04-12 DIAGNOSIS — G4733 Obstructive sleep apnea (adult) (pediatric): Secondary | ICD-10-CM | POA: Diagnosis not present

## 2016-04-12 DIAGNOSIS — R0602 Shortness of breath: Secondary | ICD-10-CM | POA: Diagnosis not present

## 2016-04-13 ENCOUNTER — Other Ambulatory Visit: Payer: Self-pay | Admitting: Internal Medicine

## 2016-04-13 ENCOUNTER — Ambulatory Visit
Admission: RE | Admit: 2016-04-13 | Discharge: 2016-04-13 | Disposition: A | Discharge status: Home / Self Care | Payer: Medicare Other | Source: Ambulatory Visit | Attending: Internal Medicine | Admitting: Internal Medicine

## 2016-04-13 DIAGNOSIS — R0602 Shortness of breath: Secondary | ICD-10-CM | POA: Insufficient documentation

## 2016-04-13 DIAGNOSIS — J45909 Unspecified asthma, uncomplicated: Secondary | ICD-10-CM | POA: Diagnosis not present

## 2016-04-13 DIAGNOSIS — J449 Chronic obstructive pulmonary disease, unspecified: Secondary | ICD-10-CM | POA: Insufficient documentation

## 2016-04-16 DIAGNOSIS — M23204 Derangement of unspecified medial meniscus due to old tear or injury, left knee: Secondary | ICD-10-CM | POA: Diagnosis not present

## 2016-04-16 DIAGNOSIS — M25562 Pain in left knee: Secondary | ICD-10-CM | POA: Diagnosis not present

## 2016-04-16 DIAGNOSIS — G8929 Other chronic pain: Secondary | ICD-10-CM | POA: Diagnosis not present

## 2016-04-16 DIAGNOSIS — M1712 Unilateral primary osteoarthritis, left knee: Secondary | ICD-10-CM | POA: Diagnosis not present

## 2016-04-19 DIAGNOSIS — D1801 Hemangioma of skin and subcutaneous tissue: Secondary | ICD-10-CM | POA: Diagnosis not present

## 2016-04-19 DIAGNOSIS — Z85828 Personal history of other malignant neoplasm of skin: Secondary | ICD-10-CM | POA: Diagnosis not present

## 2016-04-19 DIAGNOSIS — L821 Other seborrheic keratosis: Secondary | ICD-10-CM | POA: Diagnosis not present

## 2016-04-19 DIAGNOSIS — Z08 Encounter for follow-up examination after completed treatment for malignant neoplasm: Secondary | ICD-10-CM | POA: Diagnosis not present

## 2016-04-25 DIAGNOSIS — G4733 Obstructive sleep apnea (adult) (pediatric): Secondary | ICD-10-CM | POA: Diagnosis not present

## 2016-05-03 DIAGNOSIS — F250 Schizoaffective disorder, bipolar type: Secondary | ICD-10-CM | POA: Diagnosis not present

## 2016-05-03 DIAGNOSIS — Z79899 Other long term (current) drug therapy: Secondary | ICD-10-CM | POA: Diagnosis not present

## 2016-05-16 DIAGNOSIS — R0602 Shortness of breath: Secondary | ICD-10-CM | POA: Diagnosis not present

## 2016-05-21 DIAGNOSIS — L851 Acquired keratosis [keratoderma] palmaris et plantaris: Secondary | ICD-10-CM | POA: Diagnosis not present

## 2016-05-21 DIAGNOSIS — E1142 Type 2 diabetes mellitus with diabetic polyneuropathy: Secondary | ICD-10-CM | POA: Diagnosis not present

## 2016-05-21 DIAGNOSIS — L600 Ingrowing nail: Secondary | ICD-10-CM | POA: Diagnosis not present

## 2016-05-21 DIAGNOSIS — B351 Tinea unguium: Secondary | ICD-10-CM | POA: Diagnosis not present

## 2016-05-22 DIAGNOSIS — R262 Difficulty in walking, not elsewhere classified: Secondary | ICD-10-CM | POA: Diagnosis not present

## 2016-05-22 DIAGNOSIS — G44229 Chronic tension-type headache, not intractable: Secondary | ICD-10-CM | POA: Diagnosis not present

## 2016-05-22 DIAGNOSIS — G3184 Mild cognitive impairment, so stated: Secondary | ICD-10-CM | POA: Insufficient documentation

## 2016-05-22 DIAGNOSIS — Z9989 Dependence on other enabling machines and devices: Secondary | ICD-10-CM | POA: Diagnosis not present

## 2016-05-22 DIAGNOSIS — G4733 Obstructive sleep apnea (adult) (pediatric): Secondary | ICD-10-CM | POA: Diagnosis not present

## 2016-05-24 ENCOUNTER — Other Ambulatory Visit: Payer: Self-pay | Admitting: Family Medicine

## 2016-05-24 DIAGNOSIS — Z1231 Encounter for screening mammogram for malignant neoplasm of breast: Secondary | ICD-10-CM

## 2016-05-28 DIAGNOSIS — G4733 Obstructive sleep apnea (adult) (pediatric): Secondary | ICD-10-CM | POA: Diagnosis not present

## 2016-05-28 DIAGNOSIS — R0602 Shortness of breath: Secondary | ICD-10-CM | POA: Diagnosis not present

## 2016-05-28 DIAGNOSIS — J301 Allergic rhinitis due to pollen: Secondary | ICD-10-CM | POA: Diagnosis not present

## 2016-06-04 DIAGNOSIS — Z23 Encounter for immunization: Secondary | ICD-10-CM | POA: Diagnosis not present

## 2016-06-20 DIAGNOSIS — F250 Schizoaffective disorder, bipolar type: Secondary | ICD-10-CM | POA: Diagnosis not present

## 2016-06-20 DIAGNOSIS — Z79899 Other long term (current) drug therapy: Secondary | ICD-10-CM | POA: Diagnosis not present

## 2016-06-29 ENCOUNTER — Ambulatory Visit: Payer: Medicare Other | Attending: Family Medicine

## 2016-07-06 DIAGNOSIS — I10 Essential (primary) hypertension: Secondary | ICD-10-CM | POA: Diagnosis not present

## 2016-07-06 DIAGNOSIS — K219 Gastro-esophageal reflux disease without esophagitis: Secondary | ICD-10-CM | POA: Diagnosis not present

## 2016-07-06 DIAGNOSIS — Z131 Encounter for screening for diabetes mellitus: Secondary | ICD-10-CM | POA: Diagnosis not present

## 2016-07-06 DIAGNOSIS — N3281 Overactive bladder: Secondary | ICD-10-CM | POA: Diagnosis not present

## 2016-07-06 DIAGNOSIS — E039 Hypothyroidism, unspecified: Secondary | ICD-10-CM | POA: Diagnosis not present

## 2016-07-06 DIAGNOSIS — E785 Hyperlipidemia, unspecified: Secondary | ICD-10-CM | POA: Diagnosis not present

## 2016-08-07 DIAGNOSIS — N318 Other neuromuscular dysfunction of bladder: Secondary | ICD-10-CM | POA: Diagnosis not present

## 2016-08-07 DIAGNOSIS — K5909 Other constipation: Secondary | ICD-10-CM | POA: Diagnosis not present

## 2016-08-07 DIAGNOSIS — J069 Acute upper respiratory infection, unspecified: Secondary | ICD-10-CM | POA: Diagnosis not present

## 2016-08-07 DIAGNOSIS — Z029 Encounter for administrative examinations, unspecified: Secondary | ICD-10-CM | POA: Diagnosis not present

## 2016-08-21 DIAGNOSIS — E1142 Type 2 diabetes mellitus with diabetic polyneuropathy: Secondary | ICD-10-CM | POA: Diagnosis not present

## 2016-08-21 DIAGNOSIS — B351 Tinea unguium: Secondary | ICD-10-CM | POA: Diagnosis not present

## 2016-08-22 DIAGNOSIS — F250 Schizoaffective disorder, bipolar type: Secondary | ICD-10-CM | POA: Diagnosis not present

## 2016-08-29 DIAGNOSIS — B9689 Other specified bacterial agents as the cause of diseases classified elsewhere: Secondary | ICD-10-CM | POA: Diagnosis not present

## 2016-08-29 DIAGNOSIS — R21 Rash and other nonspecific skin eruption: Secondary | ICD-10-CM | POA: Diagnosis not present

## 2016-08-29 DIAGNOSIS — E7800 Pure hypercholesterolemia, unspecified: Secondary | ICD-10-CM | POA: Diagnosis not present

## 2016-08-29 DIAGNOSIS — R739 Hyperglycemia, unspecified: Secondary | ICD-10-CM | POA: Diagnosis not present

## 2016-08-29 DIAGNOSIS — J029 Acute pharyngitis, unspecified: Secondary | ICD-10-CM | POA: Diagnosis not present

## 2016-08-29 DIAGNOSIS — J019 Acute sinusitis, unspecified: Secondary | ICD-10-CM | POA: Diagnosis not present

## 2016-08-29 DIAGNOSIS — E039 Hypothyroidism, unspecified: Secondary | ICD-10-CM | POA: Diagnosis not present

## 2016-08-29 DIAGNOSIS — L739 Follicular disorder, unspecified: Secondary | ICD-10-CM | POA: Diagnosis not present

## 2016-10-24 DIAGNOSIS — G4733 Obstructive sleep apnea (adult) (pediatric): Secondary | ICD-10-CM | POA: Diagnosis not present

## 2016-10-25 DIAGNOSIS — F250 Schizoaffective disorder, bipolar type: Secondary | ICD-10-CM | POA: Diagnosis not present

## 2016-11-27 DIAGNOSIS — E1142 Type 2 diabetes mellitus with diabetic polyneuropathy: Secondary | ICD-10-CM | POA: Diagnosis not present

## 2016-11-27 DIAGNOSIS — B351 Tinea unguium: Secondary | ICD-10-CM | POA: Diagnosis not present

## 2016-11-29 DIAGNOSIS — G4733 Obstructive sleep apnea (adult) (pediatric): Secondary | ICD-10-CM | POA: Diagnosis not present

## 2016-11-29 DIAGNOSIS — J301 Allergic rhinitis due to pollen: Secondary | ICD-10-CM | POA: Diagnosis not present

## 2016-11-29 DIAGNOSIS — R0602 Shortness of breath: Secondary | ICD-10-CM | POA: Diagnosis not present

## 2016-12-13 IMAGING — CT CT HEAD W/O CM
2 series · 15 of 30 positions shown, 17 images · non-contrast
Comparison: None.

CLINICAL DATA: Status post fall today.  No loss of consciousness.

EXAM:
CT HEAD WITHOUT CONTRAST
TECHNIQUE: Contiguous axial images were obtained from the base of the skull
through the vertex without intravenous contrast.

[Series 2: head wo · axial · 0.41mm/px · z∈[-171,-56]mm · 7 of 31 slices shown, 9 images]
[im 4/31  brain]
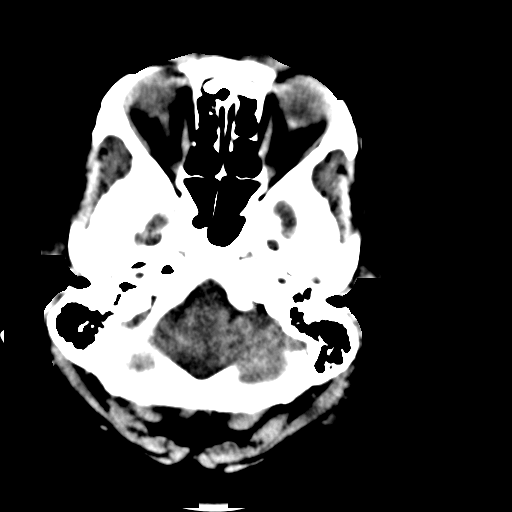
[im 4/31  bone]
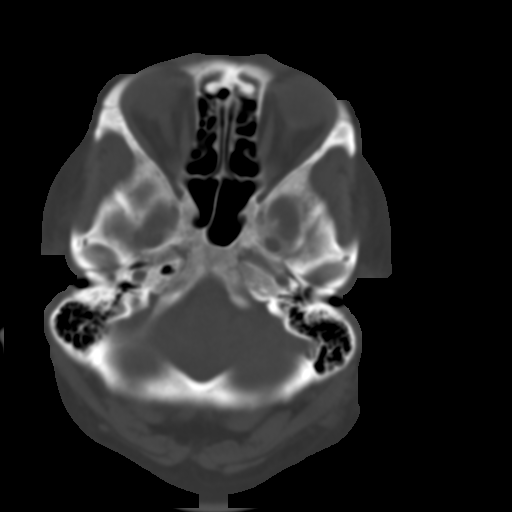
[im 8/31  brain]
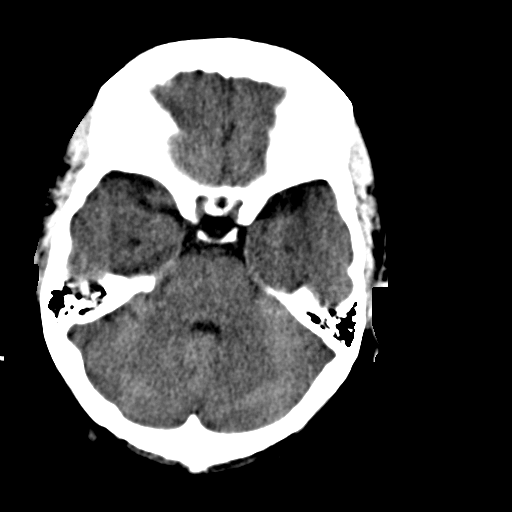
[im 12/31  brain]
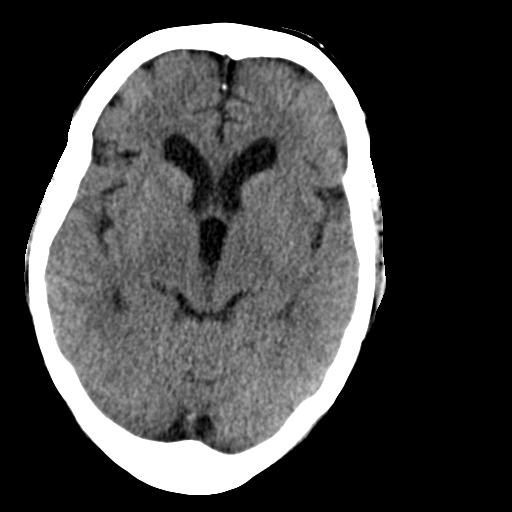
[im 16/31  brain]
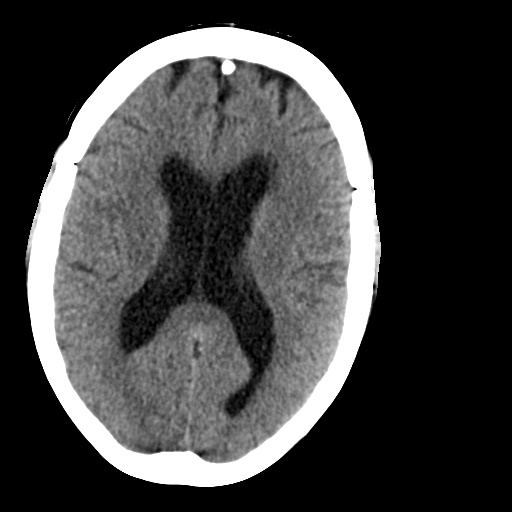
[im 19/31  brain]
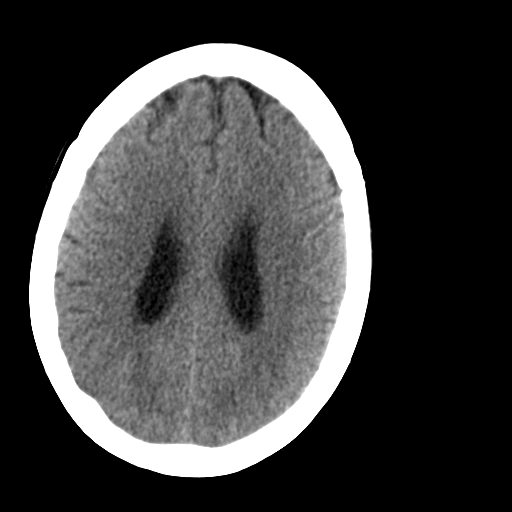
[im 19/31  bone]
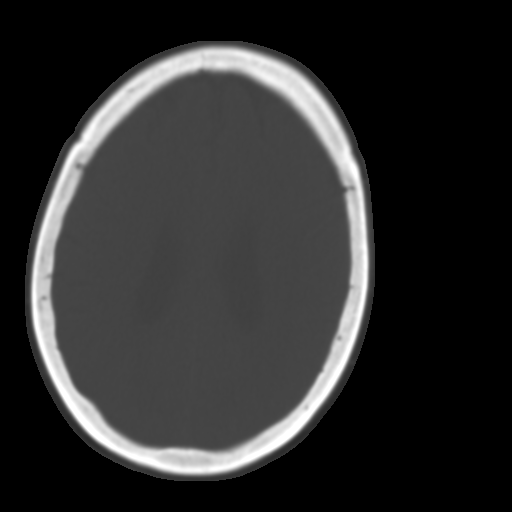
[im 23/31  brain]
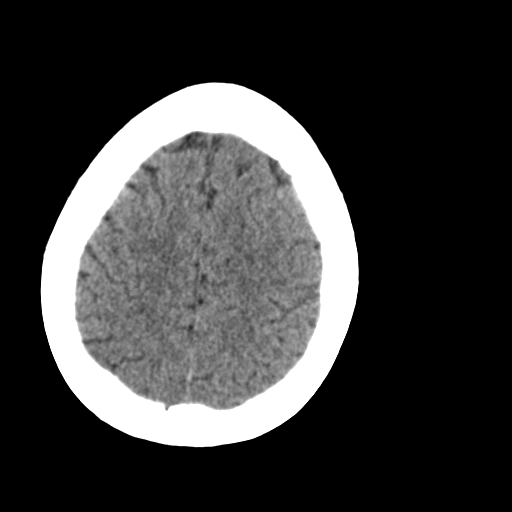
[im 27/31  brain]
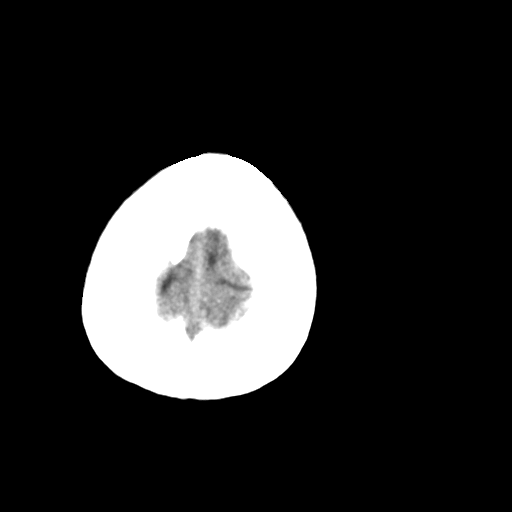

[Series 3: head bone · axial · 0.41mm/px · z∈[-172,-50]mm · 8 of 77 slices shown]
[im 8/77  bone]
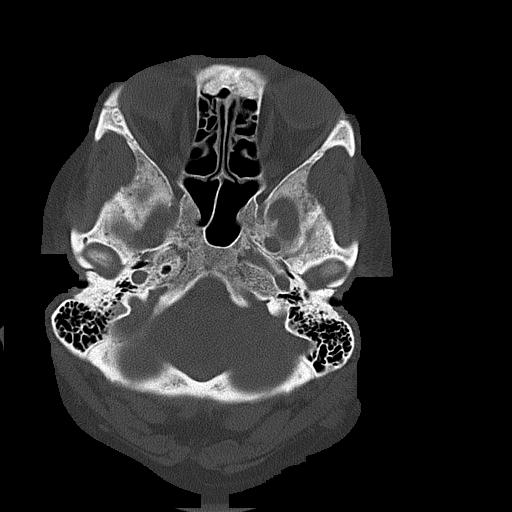
[im 16/77  bone]
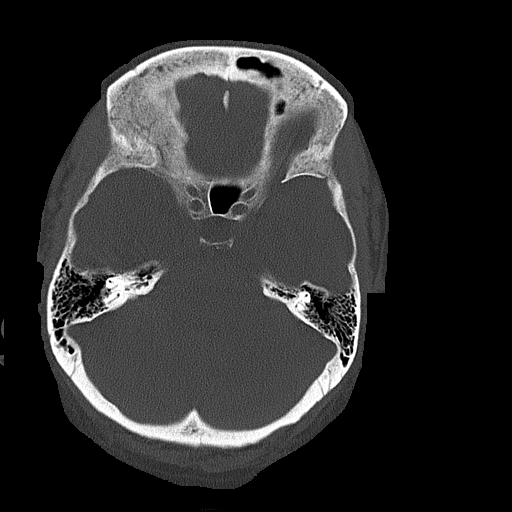
[im 23/77  bone]
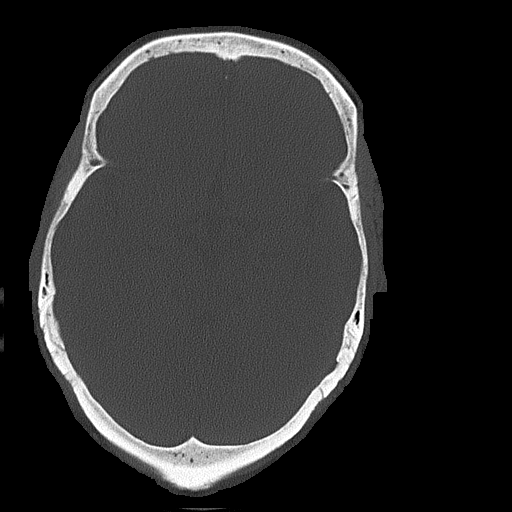
[im 35/77  bone]
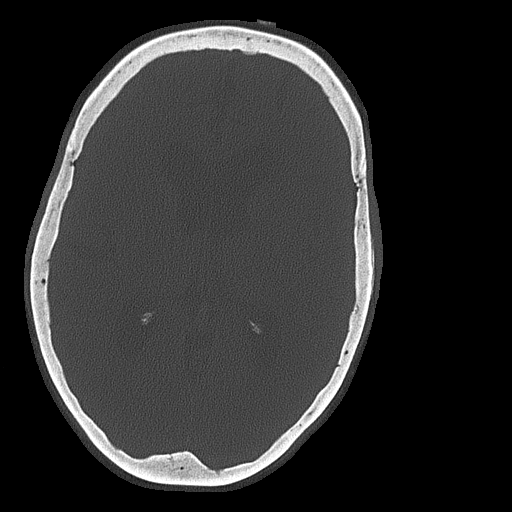
[im 42/77  bone]
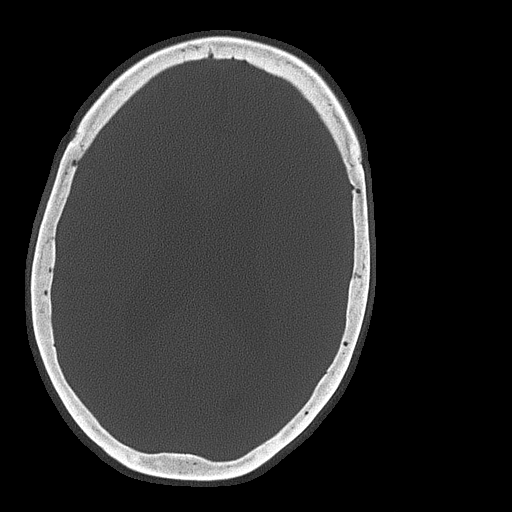
[im 54/77  bone]
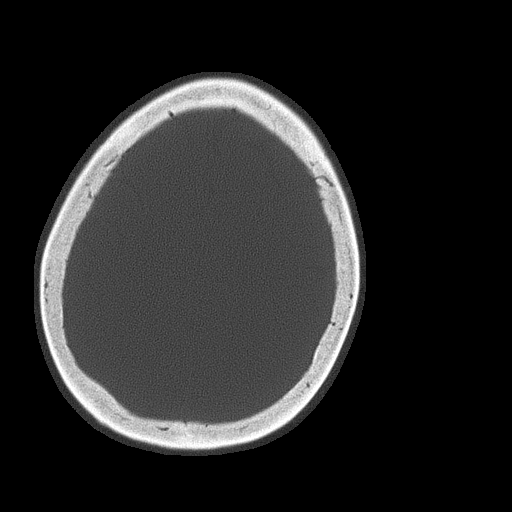
[im 61/77  bone]
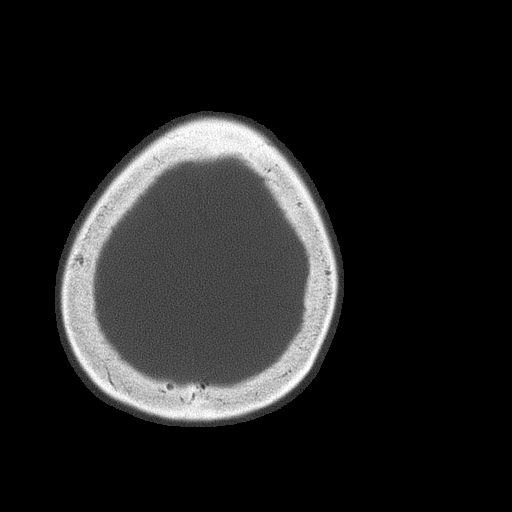
[im 69/77  bone]
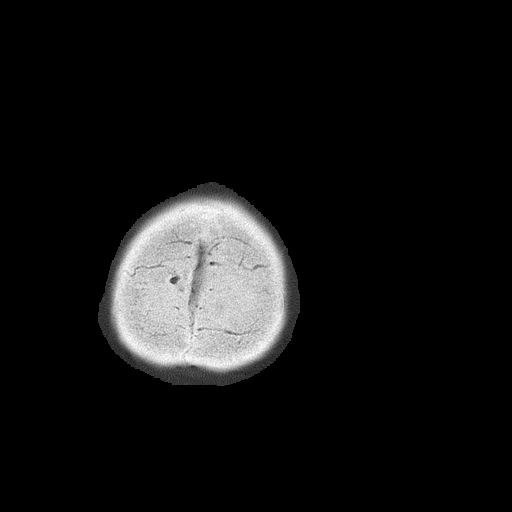

[15 of 30 positions shown; findings below may reference images not displayed]

FINDINGS: The brain appears normal without hemorrhage, infarct, mass lesion,
mass effect, midline shift or abnormal extra-axial fluid collection.
No hydrocephalus or pneumocephalus. The calvarium is intact.
IMPRESSION: Negative head CT.

## 2016-12-20 DIAGNOSIS — F250 Schizoaffective disorder, bipolar type: Secondary | ICD-10-CM | POA: Diagnosis not present

## 2016-12-25 DIAGNOSIS — Z9989 Dependence on other enabling machines and devices: Secondary | ICD-10-CM | POA: Diagnosis not present

## 2016-12-25 DIAGNOSIS — R011 Cardiac murmur, unspecified: Secondary | ICD-10-CM | POA: Diagnosis not present

## 2016-12-25 DIAGNOSIS — R0602 Shortness of breath: Secondary | ICD-10-CM | POA: Diagnosis not present

## 2016-12-25 DIAGNOSIS — R413 Other amnesia: Secondary | ICD-10-CM | POA: Diagnosis not present

## 2016-12-25 DIAGNOSIS — R079 Chest pain, unspecified: Secondary | ICD-10-CM | POA: Diagnosis not present

## 2016-12-25 DIAGNOSIS — R7303 Prediabetes: Secondary | ICD-10-CM | POA: Diagnosis not present

## 2016-12-25 DIAGNOSIS — Z6841 Body Mass Index (BMI) 40.0 and over, adult: Secondary | ICD-10-CM | POA: Diagnosis not present

## 2016-12-25 DIAGNOSIS — G4733 Obstructive sleep apnea (adult) (pediatric): Secondary | ICD-10-CM | POA: Diagnosis not present

## 2016-12-25 DIAGNOSIS — I340 Nonrheumatic mitral (valve) insufficiency: Secondary | ICD-10-CM | POA: Diagnosis not present

## 2016-12-25 DIAGNOSIS — K219 Gastro-esophageal reflux disease without esophagitis: Secondary | ICD-10-CM | POA: Diagnosis not present

## 2016-12-25 DIAGNOSIS — F259 Schizoaffective disorder, unspecified: Secondary | ICD-10-CM | POA: Diagnosis not present

## 2017-01-11 DIAGNOSIS — M1712 Unilateral primary osteoarthritis, left knee: Secondary | ICD-10-CM | POA: Diagnosis not present

## 2017-01-11 DIAGNOSIS — M23204 Derangement of unspecified medial meniscus due to old tear or injury, left knee: Secondary | ICD-10-CM | POA: Diagnosis not present

## 2017-01-11 DIAGNOSIS — M170 Bilateral primary osteoarthritis of knee: Secondary | ICD-10-CM | POA: Diagnosis not present

## 2017-01-17 DIAGNOSIS — R011 Cardiac murmur, unspecified: Secondary | ICD-10-CM | POA: Diagnosis not present

## 2017-01-17 DIAGNOSIS — R0602 Shortness of breath: Secondary | ICD-10-CM | POA: Diagnosis not present

## 2017-01-18 DIAGNOSIS — M170 Bilateral primary osteoarthritis of knee: Secondary | ICD-10-CM | POA: Diagnosis not present

## 2017-01-23 DIAGNOSIS — J069 Acute upper respiratory infection, unspecified: Secondary | ICD-10-CM | POA: Diagnosis not present

## 2017-01-25 DIAGNOSIS — M170 Bilateral primary osteoarthritis of knee: Secondary | ICD-10-CM | POA: Diagnosis not present

## 2017-02-01 DIAGNOSIS — M1711 Unilateral primary osteoarthritis, right knee: Secondary | ICD-10-CM | POA: Diagnosis not present

## 2017-02-26 DIAGNOSIS — E039 Hypothyroidism, unspecified: Secondary | ICD-10-CM | POA: Diagnosis not present

## 2017-02-26 DIAGNOSIS — R739 Hyperglycemia, unspecified: Secondary | ICD-10-CM | POA: Diagnosis not present

## 2017-02-26 DIAGNOSIS — Z1231 Encounter for screening mammogram for malignant neoplasm of breast: Secondary | ICD-10-CM | POA: Diagnosis not present

## 2017-02-26 DIAGNOSIS — R21 Rash and other nonspecific skin eruption: Secondary | ICD-10-CM | POA: Diagnosis not present

## 2017-02-26 DIAGNOSIS — M1712 Unilateral primary osteoarthritis, left knee: Secondary | ICD-10-CM | POA: Diagnosis not present

## 2017-02-26 DIAGNOSIS — M23204 Derangement of unspecified medial meniscus due to old tear or injury, left knee: Secondary | ICD-10-CM | POA: Diagnosis not present

## 2017-03-18 DIAGNOSIS — E1142 Type 2 diabetes mellitus with diabetic polyneuropathy: Secondary | ICD-10-CM | POA: Diagnosis not present

## 2017-03-18 DIAGNOSIS — B351 Tinea unguium: Secondary | ICD-10-CM | POA: Diagnosis not present

## 2017-03-20 DIAGNOSIS — G4733 Obstructive sleep apnea (adult) (pediatric): Secondary | ICD-10-CM | POA: Diagnosis not present

## 2017-03-20 DIAGNOSIS — J312 Chronic pharyngitis: Secondary | ICD-10-CM | POA: Diagnosis not present

## 2017-03-21 DIAGNOSIS — F250 Schizoaffective disorder, bipolar type: Secondary | ICD-10-CM | POA: Diagnosis not present

## 2017-03-26 ENCOUNTER — Other Ambulatory Visit: Payer: Self-pay | Admitting: Family Medicine

## 2017-03-26 DIAGNOSIS — Z1231 Encounter for screening mammogram for malignant neoplasm of breast: Secondary | ICD-10-CM

## 2017-04-09 DIAGNOSIS — R262 Difficulty in walking, not elsewhere classified: Secondary | ICD-10-CM | POA: Diagnosis not present

## 2017-04-09 DIAGNOSIS — G629 Polyneuropathy, unspecified: Secondary | ICD-10-CM | POA: Diagnosis not present

## 2017-04-09 DIAGNOSIS — Z23 Encounter for immunization: Secondary | ICD-10-CM | POA: Diagnosis not present

## 2017-06-06 DIAGNOSIS — G4733 Obstructive sleep apnea (adult) (pediatric): Secondary | ICD-10-CM | POA: Diagnosis not present

## 2017-06-06 DIAGNOSIS — R05 Cough: Secondary | ICD-10-CM | POA: Diagnosis not present

## 2017-06-06 DIAGNOSIS — J301 Allergic rhinitis due to pollen: Secondary | ICD-10-CM | POA: Diagnosis not present

## 2017-06-06 DIAGNOSIS — R0602 Shortness of breath: Secondary | ICD-10-CM | POA: Diagnosis not present

## 2017-06-13 DIAGNOSIS — F250 Schizoaffective disorder, bipolar type: Secondary | ICD-10-CM | POA: Diagnosis not present

## 2017-06-13 DIAGNOSIS — Z79899 Other long term (current) drug therapy: Secondary | ICD-10-CM | POA: Diagnosis not present

## 2017-06-18 DIAGNOSIS — E1142 Type 2 diabetes mellitus with diabetic polyneuropathy: Secondary | ICD-10-CM | POA: Diagnosis not present

## 2017-06-18 DIAGNOSIS — B351 Tinea unguium: Secondary | ICD-10-CM | POA: Diagnosis not present

## 2017-06-27 DIAGNOSIS — I340 Nonrheumatic mitral (valve) insufficiency: Secondary | ICD-10-CM | POA: Diagnosis not present

## 2017-06-27 DIAGNOSIS — F259 Schizoaffective disorder, unspecified: Secondary | ICD-10-CM | POA: Diagnosis not present

## 2017-06-27 DIAGNOSIS — Z9989 Dependence on other enabling machines and devices: Secondary | ICD-10-CM | POA: Diagnosis not present

## 2017-06-27 DIAGNOSIS — R600 Localized edema: Secondary | ICD-10-CM | POA: Diagnosis not present

## 2017-06-27 DIAGNOSIS — K219 Gastro-esophageal reflux disease without esophagitis: Secondary | ICD-10-CM | POA: Diagnosis not present

## 2017-06-27 DIAGNOSIS — R7303 Prediabetes: Secondary | ICD-10-CM | POA: Diagnosis not present

## 2017-06-27 DIAGNOSIS — R0602 Shortness of breath: Secondary | ICD-10-CM | POA: Diagnosis not present

## 2017-06-27 DIAGNOSIS — R011 Cardiac murmur, unspecified: Secondary | ICD-10-CM | POA: Diagnosis not present

## 2017-06-27 DIAGNOSIS — G4733 Obstructive sleep apnea (adult) (pediatric): Secondary | ICD-10-CM | POA: Diagnosis not present

## 2017-07-03 DIAGNOSIS — R0602 Shortness of breath: Secondary | ICD-10-CM | POA: Diagnosis not present

## 2017-07-19 DIAGNOSIS — Z85828 Personal history of other malignant neoplasm of skin: Secondary | ICD-10-CM | POA: Diagnosis not present

## 2017-07-19 DIAGNOSIS — Z08 Encounter for follow-up examination after completed treatment for malignant neoplasm: Secondary | ICD-10-CM | POA: Diagnosis not present

## 2017-07-19 DIAGNOSIS — L918 Other hypertrophic disorders of the skin: Secondary | ICD-10-CM | POA: Diagnosis not present

## 2017-09-11 ENCOUNTER — Ambulatory Visit (INDEPENDENT_AMBULATORY_CARE_PROVIDER_SITE_OTHER): Payer: Medicare Other

## 2017-09-11 DIAGNOSIS — G4733 Obstructive sleep apnea (adult) (pediatric): Secondary | ICD-10-CM

## 2017-09-11 NOTE — Progress Notes (Signed)
95 percentile pressure 10   95th percentile leak 14.9   apnea index 2.3 /hr  apnea-hypopnea index  3.2 /hr   total days used  >4 hr 71 days  total days used <4 hr 1 days  Total compliance 79 percent  Debbie Cruz is waking in the middle of the night and doesn't know why. Her reading on the cpap looks good, asked that she speak to Dr. Humphrey Rolls about if it continues

## 2017-10-08 ENCOUNTER — Ambulatory Visit
Admission: RE | Admit: 2017-10-08 | Discharge: 2017-10-08 | Disposition: A | Discharge status: Home / Self Care | Payer: Medicare Other | Source: Ambulatory Visit | Attending: Family Medicine | Admitting: Family Medicine

## 2017-10-08 DIAGNOSIS — Z1231 Encounter for screening mammogram for malignant neoplasm of breast: Secondary | ICD-10-CM | POA: Diagnosis present

## 2017-12-05 ENCOUNTER — Other Ambulatory Visit: Payer: Self-pay

## 2017-12-05 ENCOUNTER — Encounter: Payer: Self-pay | Admitting: Internal Medicine

## 2017-12-05 ENCOUNTER — Ambulatory Visit (INDEPENDENT_AMBULATORY_CARE_PROVIDER_SITE_OTHER): Payer: Medicare Other | Admitting: Internal Medicine

## 2017-12-05 VITALS — BP 144/50 | HR 76 | Resp 16 | Ht 60.0 in | Wt 228.6 lb

## 2017-12-05 DIAGNOSIS — J440 Chronic obstructive pulmonary disease with acute lower respiratory infection: Secondary | ICD-10-CM

## 2017-12-05 DIAGNOSIS — J209 Acute bronchitis, unspecified: Secondary | ICD-10-CM | POA: Diagnosis not present

## 2017-12-05 DIAGNOSIS — G4733 Obstructive sleep apnea (adult) (pediatric): Secondary | ICD-10-CM | POA: Diagnosis not present

## 2017-12-05 DIAGNOSIS — Z9989 Dependence on other enabling machines and devices: Secondary | ICD-10-CM | POA: Diagnosis not present

## 2017-12-05 DIAGNOSIS — J301 Allergic rhinitis due to pollen: Secondary | ICD-10-CM

## 2017-12-05 MED ORDER — AZITHROMYCIN 250 MG PO TABS: ORAL_TABLET | ORAL | 0 refills | Status: DC

## 2017-12-05 NOTE — Patient Instructions (Signed)

## 2017-12-05 NOTE — Progress Notes (Signed)
[facility] [place], [place] [address]  Pulmonary Sleep Medicine   Office Visit Note  Patient Name: Debbie Cruz DOB: 15-Aug-1945 MRN [id number]  Date of Service: 12/05/2017  Complaints/HPI: Patient is here for follow-up.  She says she has been having some cough and congestion.  She is at low bit of shortness of breath no chest pain no palpitations.  States that she has been using her CPAP her last download shows compliance of 79%.  She does have allergic rhinitis on she states that she remains on allergy medications as prescribed  ROS  General: (-) fever, (-) chills, (-) night sweats, (-) weakness Skin: (-) rashes, (-) itching,. Eyes: (-) visual changes, (-) redness, (-) itching. Nose and Sinuses: (-) nasal stuffiness or itchiness, (-) postnasal drip, (-) nosebleeds, (-) sinus trouble. Mouth and Throat: (-) sore throat, (-) hoarseness. Neck: (-) swollen glands, (-) enlarged thyroid, (-) neck pain. Respiratory: + cough, (-) bloody sputum, + shortness of breath, + wheezing. Cardiovascular: - ankle swelling, (-) chest pain. Lymphatic: (-) lymph node enlargement. Neurologic: (-) numbness, (-) tingling. Psychiatric: (-) anxiety, (-) depression   Current Medication: Outpatient Encounter Medications as of 12/05/2017  Medication Sig Note  . acetaminophen (TYLENOL) 500 MG tablet Take 500 mg by mouth every 6 (six) hours as needed. Reported on 08/17/2015   . buPROPion (WELLBUTRIN XL) 300 MG 24 hr tablet  12/28/2015: Received from: External Pharmacy  . Calcium Carbonate-Vitamin D (CALCIUM-VITAMIN D) 500-200 MG-UNIT per tablet Take 1 tablet by mouth daily.   Marland Kitchen donepezil (ARICEPT) 5 MG tablet Reported on 12/28/2015 05/24/2015: Received from: [facility]  . gabapentin (NEURONTIN) 300 MG capsule Take 300 mg by mouth. Reported on 12/28/2015 05/24/2015: Received from: [facility]  . levothyroxine (SYNTHROID, LEVOTHROID) 137 MCG tablet  06/08/2015:  Received from: External Pharmacy  . loratadine (CLARITIN) 10 MG tablet Take 10 mg by mouth. 05/24/2015: Received from: [facility]  . menthol-cetylpyridinium (CEPACOL) 3 MG lozenge Take 1 lozenge by mouth as needed for sore throat.   Marland Kitchen MYRBETRIQ 25 MG TB24 tablet  10/19/2015: Received from: External Pharmacy  . pantoprazole (PROTONIX) 40 MG tablet TAKE ONE TABLET BY MOUTH EACH DAY. *DO NOT CRUSH* 05/24/2015: Received from: [facility]  . polyethylene glycol (MIRALAX / GLYCOLAX) packet Take by mouth. 05/24/2015: Received from: [facility]  . pravastatin (PRAVACHOL) 40 MG tablet Take 40 mg by mouth. 05/24/2015: Received from: [facility]  . risperiDONE (RISPERDAL) 3 MG tablet  06/08/2015: Received from: External Pharmacy  . sertraline (ZOLOFT) 100 MG tablet Take 100 mg by mouth daily.   . sodium chloride (OCEAN) 0.65 % nasal spray Place into the nose. 05/24/2015: Received from: [facility]  . topiramate (TOPAMAX) 25 MG tablet Take 1 capsule at night for 1 week, then increase to 2 capsules at night and continue 05/24/2015: Received from: [facility]  . traMADol (ULTRAM) 50 MG tablet Take 50 mg by mouth every 6 (six) hours as needed. Reported on 11/23/2015    No facility-administered encounter medications on file as of 12/05/2017.     Surgical History: Past Surgical History:  Procedure Laterality Date  . BREAST CYST ASPIRATION Left   . CARPAL TUNNEL RELEASE  2003/2006  . Home  . THYROIDECTOMY  2000  . Ulna nerve surgery Bilateral 2003/2006    Medical History: Past Medical History:  Diagnosis Date  . Acid reflux   . Anxiety   .  Arthritis   . Cancer ([place])    skin  . Depression   . Diabetes mellitus without complication ([place])   . Heart disease   . Heart murmur   . HLD (hyperlipidemia)   . 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 Migraines   . Mixed incontinence   . Obstructive apnea 02/02/2015  . Schizo-affective  schizophrenia ([place])   . Thyroid disease     Family History: Family History  Problem Relation Age of Onset  . Breast cancer Maternal Grandmother   . Breast cancer Maternal Grandfather   . Hematuria Father   . Kidney disease Neg Hx     Social History: Social History   Socioeconomic History  . Marital status: Divorced    Spouse name: Not on file  . Number of children: Not on file  . Years of education: Not on file  . Highest education level: Not on file  Occupational History  . Not on file  Social Needs  . Financial resource strain: Not on file  . Food insecurity:    Worry: Not on file    Inability: Not on file  . Transportation needs:    Medical: Not on file    Non-medical: Not on file  Tobacco Use  . Smoking status: Never Smoker  . Smokeless tobacco: Never Used  Substance and Sexual Activity  . Alcohol use: No    Alcohol/week: 0.0 oz  . Drug use: No  . Sexual activity: Not on file  Lifestyle  . Physical activity:    Days per week: Not on file    Minutes per session: Not on file  . Stress: Not on file  Relationships  . Social connections:    Talks on phone: Not on file    Gets together: Not on file    Attends religious service: Not on file    Active member of club or organization: Not on file    Attends meetings of clubs or organizations: Not on file    Relationship status: Not on file  . Intimate partner violence:    Fear of current or ex partner: Not on file    Emotionally abused: Not on file    Physically abused: Not on file    Forced sexual activity: Not on file  Other Topics Concern  . Not on file  Social History Narrative  . Not on file    Vital Signs: Blood pressure (!) 144/50, pulse 76, resp. rate 16, height 5' (1.524 m), weight 228 lb 9.6 oz (103.7 kg), SpO2 96 %.  Examination: General Appearance: The patient is well-developed, well-nourished, and in no distress. Skin: Gross inspection of skin unremarkable. Head: normocephalic, no gross  deformities. Eyes: no gross deformities noted. ENT: ears appear grossly normal no exudates. Neck: Supple. No thyromegaly. No LAD. Respiratory: scattered rhonchi noted. Cardiovascular: Normal S1 and S2 without murmur or rub. Extremities: No cyanosis. pulses are equal. Neurologic: Alert and oriented. No involuntary movements.  LABS: No results found for this or any previous visit (from the past 2160 hour(s)).  Radiology: Mm Digital Screening Bilateral  Result Date: 10/08/2017 CLINICAL DATA:  Screening. EXAM: DIGITAL SCREENING BILATERAL MAMMOGRAM WITH CAD COMPARISON:  Previous exam(s). ACR Breast Density Category b: There are scattered areas of fibroglandular density. FINDINGS: There are no findings suspicious for malignancy. Images were processed with CAD. IMPRESSION: No mammographic evidence of malignancy. A result letter of this screening mammogram will be mailed directly to the patient. RECOMMENDATION: Screening mammogram in one year. (Code:SM-B-01Y) BI-RADS CATEGORY  1: Negative. Electronically Signed   By: Lajean Manes M.D.   On: 10/08/2017 12:13    No results found.  No results found.    Assessment and Plan: Patient Active Problem List   Diagnosis Date Noted  . Chronic tension-type headache, not intractable 05/22/2016  . MCI (mild cognitive impairment) 05/22/2016  . Primary osteoarthritis of left knee 01/16/2016  . Breathlessness on exertion 07/28/2015  . Arthritis sicca 06/21/2015  . Morbid (severe) obesity due to excess calories ([place]) 06/21/2015  . Morbid obesity with BMI of 45.0-49.9, adult ([place]) 06/21/2015  . Schizoaffective disorder ([place]) 06/08/2015  . Absence of bladder continence 05/29/2015  . Cephalalgia 05/18/2015  . Chest pain 03/14/2015  . Difficulty in walking 02/02/2015  . Obstructive apnea 02/02/2015  . Excessive falling 02/01/2015  . Shuffling gait 02/01/2015  . Amnesia 12/20/2014  . Polyneuropathy 12/20/2014  . Poor concentration 12/20/2014  .  Incomplete bladder emptying 09/14/2013  . Mixed incontinence 09/14/2013  . 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 Acquired hypothyroidism 12/08/2012  . Anxiety 12/08/2012  . Clinical depression 12/08/2012  . Acid reflux 12/08/2012  . Blood glucose elevated 12/08/2012  . Incontinence 12/08/2012  . Pure hypercholesterolemia 12/08/2012  . Apnea, sleep 12/08/2012  . Recurrent major depressive disorder, in full remission ([place]) 12/08/2012    1. OSAshe needs to continue with her CPAP as prescribed. Currently with 79% compliance would like to see this improved 2. Acute sinusitis will place on a zpack at this time 3. Allergic rhinitis she is taking her meds for allergies as prescribed  General Counseling: I have discussed the findings of the evaluation and examination with Hoyle Sauer.  I have also discussed any further diagnostic evaluation thatmay be needed or ordered today. Myria verbalizes understanding of the findings of todays visit. We also reviewed her medications today and discussed drug interactions and side effects including but not limited excessive drowsiness and altered mental states. We also discussed that there is always a risk not just to her but also people around her. she has been encouraged to call the office with any questions or concerns that should arise related to todays visit.    Time spent: 60min  I have personally obtained a history, examined the patient, evaluated laboratory and imaging results, formulated the assessment and plan and placed orders.    Allyne Gee, MD [facility] Pulmonary and Critical Care Sleep medicine

## 2018-01-06 ENCOUNTER — Ambulatory Visit (INDEPENDENT_AMBULATORY_CARE_PROVIDER_SITE_OTHER): Payer: Medicare Other | Admitting: Internal Medicine

## 2018-01-06 ENCOUNTER — Encounter: Payer: Self-pay | Admitting: Internal Medicine

## 2018-01-06 VITALS — BP 148/58 | HR 80 | Resp 16 | Ht 59.0 in | Wt 230.8 lb

## 2018-01-06 DIAGNOSIS — Z9989 Dependence on other enabling machines and devices: Secondary | ICD-10-CM

## 2018-01-06 DIAGNOSIS — K219 Gastro-esophageal reflux disease without esophagitis: Secondary | ICD-10-CM | POA: Diagnosis not present

## 2018-01-06 DIAGNOSIS — G4733 Obstructive sleep apnea (adult) (pediatric): Secondary | ICD-10-CM | POA: Diagnosis not present

## 2018-01-06 DIAGNOSIS — J440 Chronic obstructive pulmonary disease with acute lower respiratory infection: Secondary | ICD-10-CM

## 2018-01-06 DIAGNOSIS — J209 Acute bronchitis, unspecified: Secondary | ICD-10-CM

## 2018-01-06 MED ORDER — LEVOFLOXACIN 500 MG PO TABS: 500.0000 mg | ORAL_TABLET | Freq: Every day | ORAL | 0 refills | Status: DC

## 2018-01-06 NOTE — Patient Instructions (Signed)

## 2018-01-06 NOTE — Progress Notes (Signed)
[facility] [place], [place] [address]  Pulmonary Sleep Medicine   Office Visit Note  Patient Name: Debbie Cruz DOB: [birth year] MRN [id number]  Date of Service: 01/06/2018  Complaints/HPI:   Patient presents with acute bronchitis.  She states that it is related to her CPAP.  I spoke to her at length regarding her CPAP.  Apparently she is using twice a soap to wash her CPAP and she washes it once every 3 or 4 days.  Patient also apparently does not done to water out in the mornings.  I had a lengthy conversation about cleanliness regarding her CPAP device and it can perpetuate these infections.  She and I discussed the different methods that are available she is not able to afford the new self cleaning devices so we discussed using Good air and water to help with keeping the CPAP water chamber as clean as possible.  Today she does heavy acute bronchitis which will require treatment  ROS  General: (-) fever, (-) chills, (-) night sweats, (-) weakness Skin: (-) rashes, (-) itching,. Eyes: (-) visual changes, (-) redness, (-) itching. Nose and Sinuses: (-) nasal stuffiness or itchiness, (-) postnasal drip, (-) nosebleeds, (-) sinus trouble. Mouth and Throat: (-) sore throat, (-) hoarseness. Neck: (-) swollen glands, (-) enlarged thyroid, (-) neck pain. Respiratory: + cough, (-) bloody sputum, + shortness of breath, + wheezing. Cardiovascular: - ankle swelling, (-) chest pain. Lymphatic: (-) lymph node enlargement. Neurologic: (-) numbness, (-) tingling. Psychiatric: (-) anxiety, (-) depression   Current Medication: Outpatient Encounter Medications as of 01/06/2018  Medication Sig Note  . acetaminophen (TYLENOL) 500 MG tablet Take 500 mg by mouth every 6 (six) hours as needed. Reported on 08/17/2015   . azithromycin (ZITHROMAX) 250 MG tablet As directed z pack   . buPROPion (WELLBUTRIN XL) 300 MG 24 hr tablet  12/28/2015: Received from: External Pharmacy  .  Calcium Carbonate-Vitamin D (CALCIUM-VITAMIN D) 500-200 MG-UNIT per tablet Take 1 tablet by mouth daily.   Marland Kitchen donepezil (ARICEPT) 5 MG tablet Reported on 12/28/2015 05/24/2015: Received from: [facility]  . gabapentin (NEURONTIN) 300 MG capsule Take 300 mg by mouth. Reported on 12/28/2015 05/24/2015: Received from: [facility]  . levothyroxine (SYNTHROID, LEVOTHROID) 137 MCG tablet  06/08/2015: Received from: External Pharmacy  . loratadine (CLARITIN) 10 MG tablet Take 10 mg by mouth. 05/24/2015: Received from: [facility]  . menthol-cetylpyridinium (CEPACOL) 3 MG lozenge Take 1 lozenge by mouth as needed for sore throat.   Marland Kitchen MYRBETRIQ 25 MG TB24 tablet  10/19/2015: Received from: External Pharmacy  . pantoprazole (PROTONIX) 40 MG tablet TAKE ONE TABLET BY MOUTH EACH DAY. *DO NOT CRUSH* 05/24/2015: Received from: [facility]  . polyethylene glycol (MIRALAX / GLYCOLAX) packet Take by mouth. 05/24/2015: Received from: [facility]  . pravastatin (PRAVACHOL) 40 MG tablet Take 40 mg by mouth. 05/24/2015: Received from: [facility]  . risperiDONE (RISPERDAL) 3 MG tablet  06/08/2015: Received from: External Pharmacy  . sertraline (ZOLOFT) 100 MG tablet Take 100 mg by mouth daily.   . sodium chloride (OCEAN) 0.65 % nasal spray Place into the nose. 05/24/2015: Received from: [facility]  . topiramate (TOPAMAX) 25 MG tablet Take 1 capsule at night for 1 week, then increase to 2 capsules at night and continue 05/24/2015: Received from: [facility]  . traMADol (ULTRAM) 50 MG tablet Take 50 mg by mouth every 6 (six) hours as  needed. Reported on 11/23/2015    No facility-administered encounter medications on file as of 01/06/2018.     Surgical History: Past Surgical History:  Procedure Laterality Date  . BREAST CYST ASPIRATION Left   . CARPAL TUNNEL RELEASE  2003/2006  . [place]  . THYROIDECTOMY  2000  . Ulna nerve surgery  Bilateral 2003/2006    Medical History: Past Medical History:  Diagnosis Date  . Acid reflux   . Anxiety   . Arthritis   . Cancer ([place])    skin  . Depression   . Diabetes mellitus without complication ([place])   . Heart disease   . Heart murmur   . HLD (hyperlipidemia)   . 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 Migraines   . Mixed incontinence   . Obstructive apnea 02/02/2015  . Schizo-affective schizophrenia ([place])   . Thyroid disease     Family History: Family History  Problem Relation Age of Onset  . Breast cancer Maternal Grandmother   . Breast cancer Maternal Grandfather   . Hematuria Father   . Kidney disease Neg Hx     Social History: Social History   Socioeconomic History  . Marital status: Divorced    Spouse name: Not on file  . Number of children: Not on file  . Years of education: Not on file  . Highest education level: Not on file  Occupational History  . Not on file  Social Needs  . Financial resource strain: Not on file  . Food insecurity:    Worry: Not on file    Inability: Not on file  . Transportation needs:    Medical: Not on file    Non-medical: Not on file  Tobacco Use  . Smoking status: Never Smoker  . Smokeless tobacco: Never Used  Substance and Sexual Activity  . Alcohol use: No    Alcohol/week: 0.0 oz  . Drug use: No  . Sexual activity: Not on file  Lifestyle  . Physical activity:    Days per week: Not on file    Minutes per session: Not on file  . Stress: Not on file  Relationships  . Social connections:    Talks on phone: Not on file    Gets together: Not on file    Attends religious service: Not on file    Active member of club or organization: Not on file    Attends meetings of clubs or organizations: Not on file    Relationship status: Not on file  . Intimate partner violence:    Fear of current or ex partner: Not on file    Emotionally abused: Not on file    Physically abused: Not on file    Forced sexual activity:  Not on file  Other Topics Concern  . Not on file  Social History Narrative  . Not on file    Vital Signs: Blood pressure (!) 148/58, pulse 80, resp. rate 16, height 4\' 11"  (1.499 m), weight 230 lb 12.8 oz (104.7 kg), SpO2 94 %.  Examination: General Appearance: The patient is well-developed, well-nourished, and in no distress. Skin: Gross inspection of skin unremarkable. Head: normocephalic, no gross deformities. Eyes: no gross deformities noted. ENT: ears appear grossly normal no exudates. Neck: Supple. No thyromegaly. No LAD. Respiratory: few rhonchi. Cardiovascular: Normal S1 and S2 without murmur or rub. Extremities: No cyanosis. pulses are equal. Neurologic: Alert and oriented. No involuntary movements.  LABS: No results found for this or any previous visit (from  the past 2160 hour(s)).  Radiology: Mm Digital Screening Bilateral  Result Date: 10/08/2017 CLINICAL DATA:  Screening. EXAM: DIGITAL SCREENING BILATERAL MAMMOGRAM WITH CAD COMPARISON:  Previous exam(s). ACR Breast Density Category b: There are scattered areas of fibroglandular density. FINDINGS: There are no findings suspicious for malignancy. Images were processed with CAD. IMPRESSION: No mammographic evidence of malignancy. A result letter of this screening mammogram will be mailed directly to the patient. RECOMMENDATION: Screening mammogram in one year. (Code:SM-B-01Y) BI-RADS CATEGORY  1: Negative. Electronically Signed   By: Lajean Manes M.D.   On: 10/08/2017 12:13    No results found.  No results found.    Assessment and Plan: Patient Active Problem List   Diagnosis Date Noted  . Chronic tension-type headache, not intractable 05/22/2016  . MCI (mild cognitive impairment) 05/22/2016  . Primary osteoarthritis of left knee 01/16/2016  . Breathlessness on exertion 07/28/2015  . Arthritis sicca 06/21/2015  . Morbid (severe) obesity due to excess calories ([place]) 06/21/2015  . Morbid obesity with BMI of  45.0-49.9, adult ([place]) 06/21/2015  . Schizoaffective disorder ([place]) 06/08/2015  . Absence of bladder continence 05/29/2015  . Cephalalgia 05/18/2015  . Chest pain 03/14/2015  . Difficulty in walking 02/02/2015  . Obstructive apnea 02/02/2015  . Excessive falling 02/01/2015  . Shuffling gait 02/01/2015  . Amnesia 12/20/2014  . Polyneuropathy 12/20/2014  . Poor concentration 12/20/2014  . Incomplete bladder emptying 09/14/2013  . Mixed incontinence 09/14/2013  . 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 Acquired hypothyroidism 12/08/2012  . Anxiety 12/08/2012  . Clinical depression 12/08/2012  . Acid reflux 12/08/2012  . Blood glucose elevated 12/08/2012  . Incontinence 12/08/2012  . Pure hypercholesterolemia 12/08/2012  . Apnea, sleep 12/08/2012  . Recurrent major depressive disorder, in full remission ([place]) 12/08/2012    1. OSA she needs to continue with her CPAP device she has moderate obstructive sleep apnea severe significant desaturations.  She also needs to keep her machine clean as we discussed today during our meeting.  She seems to understand also her caregiver was present in the room and the instructions were given from the caregiver also. 2. Acute bronchitis  She will be given a script for Levaquin today for the acute bronchitis.  Continue with supportive care 3. Morbid Obesity weight loss once again strongly recommended for OSA 4.  GERD I have scheduled her for a upper GI evaluation  General Counseling: I have discussed the findings of the evaluation and examination with Hoyle Sauer.  I have also discussed any further diagnostic evaluation thatmay be needed or ordered today. Stehanie verbalizes understanding of the findings of todays visit. We also reviewed her medications today and discussed drug interactions and side effects including but not limited excessive drowsiness and altered mental states. We also discussed that there is always a risk not just to her but also people around  her. she has been encouraged to call the office with any questions or concerns that should arise related to todays visit.    Time spent: 29min  I have personally obtained a history, examined the patient, evaluated laboratory and imaging results, formulated the assessment and plan and placed orders.    Allyne Gee, MD [facility] Pulmonary and Critical Care Sleep medicine

## 2018-01-07 ENCOUNTER — Other Ambulatory Visit: Payer: Self-pay

## 2018-01-07 DIAGNOSIS — J440 Chronic obstructive pulmonary disease with acute lower respiratory infection: Principal | ICD-10-CM

## 2018-01-07 DIAGNOSIS — J209 Acute bronchitis, unspecified: Secondary | ICD-10-CM

## 2018-01-07 MED ORDER — LEVOFLOXACIN 500 MG PO TABS: 500.0000 mg | ORAL_TABLET | Freq: Every day | ORAL | 0 refills | Status: DC

## 2018-02-04 ENCOUNTER — Other Ambulatory Visit: Payer: Self-pay | Admitting: Internal Medicine

## 2018-02-04 ENCOUNTER — Telehealth: Payer: Self-pay | Admitting: Internal Medicine

## 2018-02-04 MED ORDER — ALBUTEROL SULFATE HFA 108 (90 BASE) MCG/ACT IN AERS: 2.0000 | INHALATION_SPRAY | Freq: Four times a day (QID) | RESPIRATORY_TRACT | 0 refills | Status: AC | PRN

## 2018-02-04 NOTE — Telephone Encounter (Signed)
Pt  States that she was in to see Dr Devona Konig and in that visit talked about use of inhaler at that time she refused and now is wanting a inhaler, need to call vanessa jennings at the home she lives at for pharmacy information at [phone]

## 2018-02-06 ENCOUNTER — Ambulatory Visit: Payer: Medicare Other

## 2018-02-17 ENCOUNTER — Telehealth: Payer: Self-pay

## 2018-02-17 NOTE — Telephone Encounter (Signed)
MADE AN APPT FOR HER FOR NEXT Tuesday.

## 2018-02-17 NOTE — Telephone Encounter (Signed)
Ok she will need to come for spiro and start of symbicort or look into nebs

## 2018-02-25 ENCOUNTER — Ambulatory Visit (INDEPENDENT_AMBULATORY_CARE_PROVIDER_SITE_OTHER): Payer: Medicare Other | Admitting: Adult Health

## 2018-02-25 ENCOUNTER — Encounter: Payer: Self-pay | Admitting: Adult Health

## 2018-02-25 VITALS — BP 148/58 | HR 70 | Resp 16 | Ht 60.0 in | Wt 220.4 lb

## 2018-02-25 DIAGNOSIS — K219 Gastro-esophageal reflux disease without esophagitis: Secondary | ICD-10-CM

## 2018-02-25 DIAGNOSIS — G4733 Obstructive sleep apnea (adult) (pediatric): Secondary | ICD-10-CM | POA: Diagnosis not present

## 2018-02-25 DIAGNOSIS — R0602 Shortness of breath: Secondary | ICD-10-CM | POA: Diagnosis not present

## 2018-02-25 DIAGNOSIS — Z9989 Dependence on other enabling machines and devices: Secondary | ICD-10-CM | POA: Diagnosis not present

## 2018-02-25 NOTE — Patient Instructions (Signed)

## 2018-02-25 NOTE — Progress Notes (Signed)
[facility] [place], [place] [address]  Internal MEDICINE  Office Visit Note  Patient Name: Debbie Cruz  [id number]  [id number]  Date of Service: 02/25/2018  Chief Complaint  Patient presents with  . COPD    follow up    HPI Pt here reports at last visit she had bronchitis and was encouraged to start inhalers, she declined at that time.  She had been prescribed a rescue inhaler and has been using it twice daily instead of PRN. She was told to stop that, and now states she needs an inhaler for her copd.  She has had no albuterol for 12 days, and denies SOB, coughing or any breathing issues.      Current Medication: Outpatient Encounter Medications as of 02/25/2018  Medication Sig Note  . acetaminophen (TYLENOL) 500 MG tablet Take 500 mg by mouth every 6 (six) hours as needed. Reported on 08/17/2015   . albuterol (PROVENTIL HFA;VENTOLIN HFA) 108 (90 Base) MCG/ACT inhaler Inhale 2 puffs into the lungs every 6 (six) hours as needed for wheezing or shortness of breath.   Marland Kitchen buPROPion (WELLBUTRIN XL) 300 MG 24 hr tablet  12/28/2015: Received from: External Pharmacy  . Calcium Carbonate-Vitamin D (CALCIUM-VITAMIN D) 500-200 MG-UNIT per tablet Take 1 tablet by mouth daily.   Marland Kitchen levothyroxine (SYNTHROID, LEVOTHROID) 137 MCG tablet  06/08/2015: Received from: External Pharmacy  . loratadine (CLARITIN) 10 MG tablet Take 10 mg by mouth. 05/24/2015: Received from: [facility]  . polyethylene glycol (MIRALAX / GLYCOLAX) packet Take by mouth. 05/24/2015: Received from: [facility]  . pravastatin (PRAVACHOL) 40 MG tablet Take 40 mg by mouth. 05/24/2015: Received from: [facility]  . risperiDONE (RISPERDAL) 3 MG tablet  06/08/2015: Received from: External Pharmacy  . sertraline (ZOLOFT) 100 MG tablet Take 100 mg by mouth daily.   . sodium chloride (OCEAN) 0.65 % nasal spray Place into the nose. 05/24/2015: Received from: [facility]  . topiramate (TOPAMAX)  25 MG tablet Take 1 capsule at night for 1 week, then increase to 2 capsules at night and continue 05/24/2015: Received from: [facility]  . azithromycin (ZITHROMAX) 250 MG tablet As directed z pack (Patient not taking: Reported on 02/25/2018)   . donepezil (ARICEPT) 5 MG tablet Reported on 12/28/2015 05/24/2015: Received from: [facility]  . gabapentin (NEURONTIN) 300 MG capsule Take 300 mg by mouth. Reported on 12/28/2015 05/24/2015: Received from: [facility]  . levofloxacin (LEVAQUIN) 500 MG tablet Take 1 tablet (500 mg total) by mouth daily. (Patient not taking: Reported on 02/25/2018)   . menthol-cetylpyridinium (CEPACOL) 3 MG lozenge Take 1 lozenge by mouth as needed for sore throat.   Marland Kitchen MYRBETRIQ 25 MG TB24 tablet  10/19/2015: Received from: External Pharmacy  . pantoprazole (PROTONIX) 40 MG tablet TAKE ONE TABLET BY MOUTH EACH DAY. *DO NOT CRUSH* 05/24/2015: Received from: [place]  . traMADol (ULTRAM) 50 MG tablet Take 50 mg by mouth every 6 (six) hours as needed. Reported on 11/23/2015    No facility-administered encounter medications on file as of 02/25/2018.     Surgical History: Past Surgical History:  Procedure Laterality Date  . BREAST CYST ASPIRATION Left   . CARPAL TUNNEL RELEASE  2003/2006  . [place]  . THYROIDECTOMY  2000  . Ulna nerve surgery Bilateral 2003/2006    Medical History: Past Medical History:  Diagnosis Date  . Acid reflux   .  Anxiety   . Arthritis   . Cancer ([place])    skin  . Depression   . Diabetes mellitus without complication ([place])   . Heart disease   . Heart murmur   . HLD (hyperlipidemia)   . 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 Migraines   . Mixed incontinence   . Obstructive apnea 02/02/2015  . Schizo-affective schizophrenia ([place])   . Thyroid disease     Family History: Family History  Problem Relation Age of Onset  . Breast cancer Maternal Grandmother   . Breast  cancer Maternal Grandfather   . Hematuria Father   . Kidney disease Neg Hx     Social History   Socioeconomic History  . Marital status: Divorced    Spouse name: Not on file  . Number of children: Not on file  . Years of education: Not on file  . Highest education level: Not on file  Occupational History  . Not on file  Social Needs  . Financial resource strain: Not on file  . Food insecurity:    Worry: Not on file    Inability: Not on file  . Transportation needs:    Medical: Not on file    Non-medical: Not on file  Tobacco Use  . Smoking status: Never Smoker  . Smokeless tobacco: Never Used  Substance and Sexual Activity  . Alcohol use: No    Alcohol/week: 0.0 oz  . Drug use: No  . Sexual activity: Not on file  Lifestyle  . Physical activity:    Days per week: Not on file    Minutes per session: Not on file  . Stress: Not on file  Relationships  . Social connections:    Talks on phone: Not on file    Gets together: Not on file    Attends religious service: Not on file    Active member of club or organization: Not on file    Attends meetings of clubs or organizations: Not on file    Relationship status: Not on file  . Intimate partner violence:    Fear of current or ex partner: Not on file    Emotionally abused: Not on file    Physically abused: Not on file    Forced sexual activity: Not on file  Other Topics Concern  . Not on file  Social History Narrative  . Not on file      Review of Systems  Constitutional: Negative for chills, fatigue and unexpected weight change.  HENT: Negative for congestion.   Eyes: Negative for photophobia, pain and redness.  Respiratory: Negative for chest tightness.   Cardiovascular: Negative for palpitations.  Gastrointestinal: Negative for abdominal pain, constipation, diarrhea, nausea and vomiting.  Endocrine: Negative.   Genitourinary: Negative for dysuria and frequency.  Musculoskeletal: Negative for arthralgias,  back pain, joint swelling and neck pain.  Skin: Negative for rash.  Allergic/Immunologic: Negative.   Neurological: Negative for tremors and numbness.  Hematological: Negative for adenopathy. Does not bruise/bleed easily.  Psychiatric/Behavioral: Negative for behavioral problems and sleep disturbance. The patient is not nervous/anxious.     Vital Signs: BP (!) 148/58 (BP Location: Left Arm, Patient Position: Sitting, Cuff Size: Large)   Pulse 70   Resp 16   Ht 5' (1.524 m)   Wt 220 lb 6.4 oz (100 kg)   SpO2 97%   BMI 43.04 kg/m    Physical Exam  Constitutional: She is oriented to person, place, and time. She appears well-developed and well-nourished. No  distress.  HENT:  Head: Normocephalic and atraumatic.  Mouth/Throat: Oropharynx is clear and moist. No oropharyngeal exudate.  Eyes: Pupils are equal, round, and reactive to light. EOM are normal.  Neck: Normal range of motion. Neck supple. No JVD present. No tracheal deviation present. No thyromegaly present.  Cardiovascular: Normal rate, regular rhythm and normal heart sounds. Exam reveals no gallop and no friction rub.  No murmur heard. Pulmonary/Chest: Effort normal and breath sounds normal. No respiratory distress. She has no wheezes. She has no rales. She exhibits no tenderness.  Abdominal: Soft. There is no tenderness. There is no guarding.  Musculoskeletal: Normal range of motion.  Lymphadenopathy:    She has no cervical adenopathy.  Neurological: She is alert and oriented to person, place, and time. No cranial nerve deficit.  Skin: Skin is warm and dry. She is not diaphoretic.  Psychiatric: She has a normal mood and affect. Her behavior is normal. Judgment and thought content normal.  Nursing note and vitals reviewed.  Assessment/Plan: 1. SOB (shortness of breath) - Spirometry with graph  2. OSA on CPAP Continue to use cpap.  Follow up in sleep clinic as scheduled.  3. Gastroesophageal reflux disease without  esophagitis Continue medication as prescribed.   General Counseling: nikiya starn understanding of the findings of todays visit and agrees with plan of treatment. I have discussed any further diagnostic evaluation that may be needed or ordered today. We also reviewed her medications today. she has been encouraged to call the office with any questions or concerns that should arise related to todays visit.    Orders Placed This Encounter  Procedures  . Spirometry with graph    No orders of the defined types were placed in this encounter.   Time spent: 25 Minutes   This patient was seen by Orson Gear AGNP-C in Collaboration with Dr Lavera Guise as a part of collaborative care agreement    Dr Lavera Guise Internal medicine

## 2018-03-03 ENCOUNTER — Encounter: Payer: Self-pay | Admitting: Adult Health

## 2018-03-06 ENCOUNTER — Ambulatory Visit: Payer: Medicare Other | Attending: Family Medicine

## 2018-03-06 ENCOUNTER — Other Ambulatory Visit: Payer: Self-pay

## 2018-03-06 DIAGNOSIS — R293 Abnormal posture: Secondary | ICD-10-CM | POA: Diagnosis present

## 2018-03-06 DIAGNOSIS — M5442 Lumbago with sciatica, left side: Secondary | ICD-10-CM | POA: Insufficient documentation

## 2018-03-06 DIAGNOSIS — G8929 Other chronic pain: Secondary | ICD-10-CM | POA: Diagnosis present

## 2018-03-06 DIAGNOSIS — M5441 Lumbago with sciatica, right side: Secondary | ICD-10-CM | POA: Diagnosis present

## 2018-03-06 DIAGNOSIS — R2689 Other abnormalities of gait and mobility: Secondary | ICD-10-CM

## 2018-03-06 NOTE — Therapy (Signed)
[place] MAIN [facility] SERVICES [address] [place], [place], [address] Phone: [phone]   Fax:  [phone]  Physical Therapy Evaluation  Patient Details  Name: Debbie Cruz MRN: [id number] Date of Birth: [birth year] Referring Provider: Hortencia Pilar   Encounter Date: 03/06/2018  PT End of Session - 03/07/18 2041    Visit Number  1    Number of Visits  10    Date for PT Re-Evaluation  05/15/18    Authorization Type  Medicare    Authorization - Visit Number  1    Authorization - Number of Visits  10    PT Start Time  1430    PT Stop Time  8938    PT Time Calculation (min)  60 min    Activity Tolerance  Patient tolerated treatment well;Patient limited by lethargy;Patient limited by pain;Patient limited by fatigue    Behavior During Therapy  [facility] for tasks assessed/performed       Past Medical History:  Diagnosis Date  . Acid reflux   . Anxiety   . Arthritis   . Cancer ([place])    skin  . Depression   . Diabetes mellitus without complication ([place])   . Heart disease   . Heart murmur   . HLD (hyperlipidemia)   . 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 Migraines   . Mixed incontinence   . Obstructive apnea 02/02/2015  . Schizo-affective schizophrenia ([place])   . Thyroid disease     Past Surgical History:  Procedure Laterality Date  . BREAST CYST ASPIRATION Left   . CARPAL TUNNEL RELEASE  2003/2006  . [place]  . THYROIDECTOMY  2000  . Ulna nerve surgery Bilateral 2003/2006    There were no vitals filed for this visit.   Subjective Assessment - 03/06/18 1506    Subjective  Pt. gets dizzy if bending and standing back upright quickly but mostly was falling because of the bladder medication she was on. She has back pain but has tried PT for it before and feels that it did not help that much. She has been told that she has a spondylolisthesis that is putting pressure on her nerves and making her bottom hurt. She also  has slipped disks in her neck that gives her migraines but they have not been as bad since she entered menopause.     Pertinent History  Schizophrenia, Spondylolisthesis, neck herniated disks    Limitations  Walking    How long can you sit comfortably?  an hour if she has good back support    How long can you stand comfortably?  1 min    How long can you walk comfortably?  ~15 min and then she needs to sit down to decrease pain ing her buttocks and knees    Patient Stated Goals  To get her shoulders back to where they do not hurt when she puts a seatbelt on and to have her knees hurt less so she can walk better.     Currently in Pain?  Yes    Pain Score  6     Pain Location  Neck    Pain Orientation  Right;Left    Pain Descriptors / Indicators  Aching    Pain Type  Chronic pain    Pain Radiating Towards  down legs    Pain Onset  Other (comment)   Buttock pain ~ 2010, neck in 1970   Pain Frequency  Constant  Aggravating Factors   Walking, standing, transferring, and sitting greater than 1 hour    Pain Relieving Factors  moist heat, pain medicine    Effect of Pain on Daily Activities  50% limited in the activities she would like to do.    Multiple Pain Sites  Yes         [facility] PT Assessment - 03/07/18 0001      Assessment   Medical Diagnosis  Frequent Falls   (Pended)    Was taking medicine for urinary incontinence and it made her   Prior Therapy  --  (Pended)    Did PT for SUI but gave up on it.      Home Environment   Living Environment  Assisted living  (Pended)     Home Equipment  Walker - 4 wheels;Cane - single point;Shower seat  (Pended)       Prior Function   Level of Independence  Needs assistance with ADLs;Independent with homemaking with ambulation  (Pended)     Grooming  Maximal  (Pended)     Meal Prep  Total  (Pended)     Vocation  On disability  (Pended)     Vocation Requirements  --  (Pended)    Transferring to chair for singing and movie watching   Leisure   [place] [facility] day facility [facility] mentally disabled individuals.  (Pended)       Cognition   Overall Cognitive Status  Within Functional Limits for tasks assessed  (Pended)       Sensation   Proprioception  Not tested  (Pended)    Pt. reported decreased proprioception     Functional Tests   Functional tests  Sit to Stand  (Pended)    5x sts in 18 seconds.     Strength   Overall Strength  --  (Pended)    WNL     Balance   Balance Assessed  Yes  (Pended)     Balance comment  near LOB with narrow support at 9 seconds.   (Pended)    less than 1 second balance with SLS B. L leg increased pain                Objective measurements completed on examination: See above findings.              PT Education - 03/07/18 2039    Education Details  Educated on urge suppression technique to help decrease risk of falling and urinary incontinennce.     Person(s) Educated  Patient    Methods  Explanation;Verbal cues;Handout    Comprehension  Verbalized understanding;Verbal cues required       PT Short Term Goals - 03/07/18 2136      PT SHORT TERM GOAL #1   Title  Pt. will be able to stand with improved posture and unsupported for 1 min without increase in pain causing her to sit down    Baseline  30 second tolerance to standing, limited by pain    Time  3    Period  Weeks    Status  New    Target Date  03/28/18      PT SHORT TERM GOAL #2   Title  Pt. will report improved confidence and decreased fear of falling with transfer to chairs to watch movies and sing at day program.    Baseline  Is fearful of falling with short walk to chair at day program.    Time  5  Period  Weeks    Status  New    Target Date  04/11/18      PT SHORT TERM GOAL #3   Title  Pt. will demonstrate ability to perform 5xSTS in 15 seconds to demonstrate improved mobility and clinically meaningful decreased falls risk    Baseline  18  seconds.    Time  5    Period  Weeks    Status  New     Target Date  04/11/18        PT Long Term Goals - 03/07/18 2145      PT LONG TERM GOAL #1   Title  Pt. will demonstrate ability to perform 5xSTS in 12 seconds to demonstrate improved mobility and reduction of falls risk to within age-related norms.    Baseline  18 seconds    Time  10    Period  Weeks    Status  New    Target Date  05/16/18      PT LONG TERM GOAL #2   Title  Patient will demonstrate functional recruitment of TA with breathing, sit-to-stand, squatting/lifting, and walking to allow for improved pelvic brace coordination, improved balance, and decreased pressure on nerves to allow for improved balance and decreased pain.    Baseline  Pt. demonstrates anterior pelvic tilt with sitting, standing and walking and no TA activation to protect lumbar spine.    Time  10    Period  Weeks    Status  New    Target Date  05/16/18      PT LONG TERM GOAL #3   Title  Pt. will be able to walk for 30 min. to improve endurance, overall health, and to improve her ability to participate in meaningful activity at her home.    Baseline  is limited to ~ 15 min. of walking due to pain and fatigue.    Time  10    Period  Weeks    Status  New    Target Date  05/16/18             Plan - 03/07/18 2117    Clinical Impression Statement  Pt. is a 72 y/o female who presents  today with cheif c/o difficulty with standing, walking and transfers due to LBP and paresthesia that radiates into B LE and cntributes to decreased balance. Her PMH is significant for Schizophrenia, obesity, bulging disks in her neck and L5 spondylolisthesis (per Pt. report) as well as other generally poor health. Her clinical exam reveals decreased tolerance to standing to about 30 seconds before Pt. feels the need to sit due to increased pain and decreased proprioception in her feet as well as poor endurance and balance. Pt. will benefit from skilled PT aimed at decreasing muscle imbalance surrounding the pelvis, improving  deep core stability, and improving posture to decrease pressure on lumbar nerves to allow for improved tolerance to standing and walking and decreased pain to reduce risk of falls and allow Pt. to participate more fully in her activities at her care home and day program.      History and Personal Factors relevant to plan of care:  Schizophrenia, poor motivation and history of poor compliance or giving up on physical therapy.    Clinical Presentation  Evolving    Clinical Presentation due to:  increased pain and paresthesia that increases in standing due to increased pressure on the nerve roots of the lumbar spine and potentially the spinal cord itself.  Clinical Decision Making  Moderate    Rehab Potential  Fair    Clinical Impairments Affecting Rehab Potential  Schizophrenia, spondylolisthesis, obesity, poor motivation and history of poor compliance    PT Frequency  2x / week    PT Duration  --   10 weeks   PT Treatment/Interventions  ADLs/Self Care Home Management;Aquatic Therapy;Electrical Stimulation;Traction;Gait training;Stair training;Functional mobility training;Therapeutic activities;Therapeutic exercise;Balance training;Patient/family education;Neuromuscular re-education;Manual techniques;Dry needling;Taping    PT Next Visit Plan  ask about Urge suppression technique, educate on pelvic tilt/TA activation in seated, supine,  and standing. palpate lumbar extensors and hip-flexors and discuss DN     PT Home Exercise Plan  urge-suppression technique    Recommended Other Services  aquatic therapy    Consulted and Agree with Plan of Care  Patient       Patient will benefit from skilled therapeutic intervention in order to improve the following deficits and impairments:  Abnormal gait, Increased fascial restricitons, Impaired sensation, Improper body mechanics, Pain, Cardiopulmonary status limiting activity, Decreased coordination, Decreased mobility, Hypermobility, Increased muscle spasms,  Postural dysfunction, Decreased activity tolerance, Decreased endurance, Decreased range of motion, Decreased strength, Impaired perceived functional ability, Decreased balance, Difficulty walking, Impaired flexibility, Obesity  Visit Diagnosis: Balance problem  Chronic midline low back pain with bilateral sciatica  Abnormal posture  Other abnormalities of gait and mobility     Problem List Patient Active Problem List   Diagnosis Date Noted  . Chronic tension-type headache, not intractable 05/22/2016  . MCI (mild cognitive impairment) 05/22/2016  . Primary osteoarthritis of left knee 01/16/2016  . Breathlessness on exertion 07/28/2015  . Arthritis sicca 06/21/2015  . Morbid (severe) obesity due to excess calories ([place]) 06/21/2015  . Morbid obesity with BMI of 45.0-49.9, adult ([place]) 06/21/2015  . Schizoaffective disorder ([place]) 06/08/2015  . Absence of bladder continence 05/29/2015  . Cephalalgia 05/18/2015  . Chest pain 03/14/2015  . Difficulty in walking 02/02/2015  . Obstructive apnea 02/02/2015  . Excessive falling 02/01/2015  . Shuffling gait 02/01/2015  . Amnesia 12/20/2014  . Polyneuropathy 12/20/2014  . Poor concentration 12/20/2014  . Incomplete bladder emptying 09/14/2013  . Mixed incontinence 09/14/2013  . 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 Acquired hypothyroidism 12/08/2012  . Anxiety 12/08/2012  . Clinical depression 12/08/2012  . Acid reflux 12/08/2012  . Blood glucose elevated 12/08/2012  . Incontinence 12/08/2012  . Pure hypercholesterolemia 12/08/2012  . Apnea, sleep 12/08/2012  . Recurrent major depressive disorder, in full remission ([place]) 12/08/2012   Willa Rough DPT, ATC Willa Rough 03/07/2018, 9:56 PM  [place] MAIN [facility] SERVICES [address] [place], [place], [address] Phone: [phone]   Fax:  [phone]  Name: Debbie Cruz MRN: [id number] Date of Birth: 1945-11-09

## 2018-03-06 NOTE — Patient Instructions (Signed)
Urge supression technique:  1) Take a deep breath to convince yourself that you are in control and calm the nervous system.  2) Do 5 "quick-flick" kegels (pelvic floor muscle contractions) and re-assess the urge. Repeat another set if urge is still present. 3) Once the urge has decreased, start walking calmly to the the bathroom. Stop and repeat steps 1 and 2 as many times as needed until you can successfully get to the toilet. 4) Only once seated, take a deep breath and allow the pelvic floor muscles to relax and allow for the urine to flow.    Do not be discouraged if you are not successful the first couple times, this is normal and it will take practice but remember that YOU are in control. Start by practicing this at home where you do not have to worry as much if there were to be an accident. Allowing yourself to get rushed or nervous puts the bladder back in control and will not allow the technique to work.  

## 2018-03-07 IMAGING — CR DG CHEST 2V
1 series · 2 of 2 positions shown · non-contrast
Comparison: 11/06/2014.

CLINICAL DATA: 70-year-old with recent onset of shortness of breath
with exertion. Current history of heart murmur. Nonsmoker.

EXAM:
CHEST  2 VIEW

[Series 1: dg chest 2 view · 0.14mm/px · 2 of 2 slices shown]
[im 1/2]
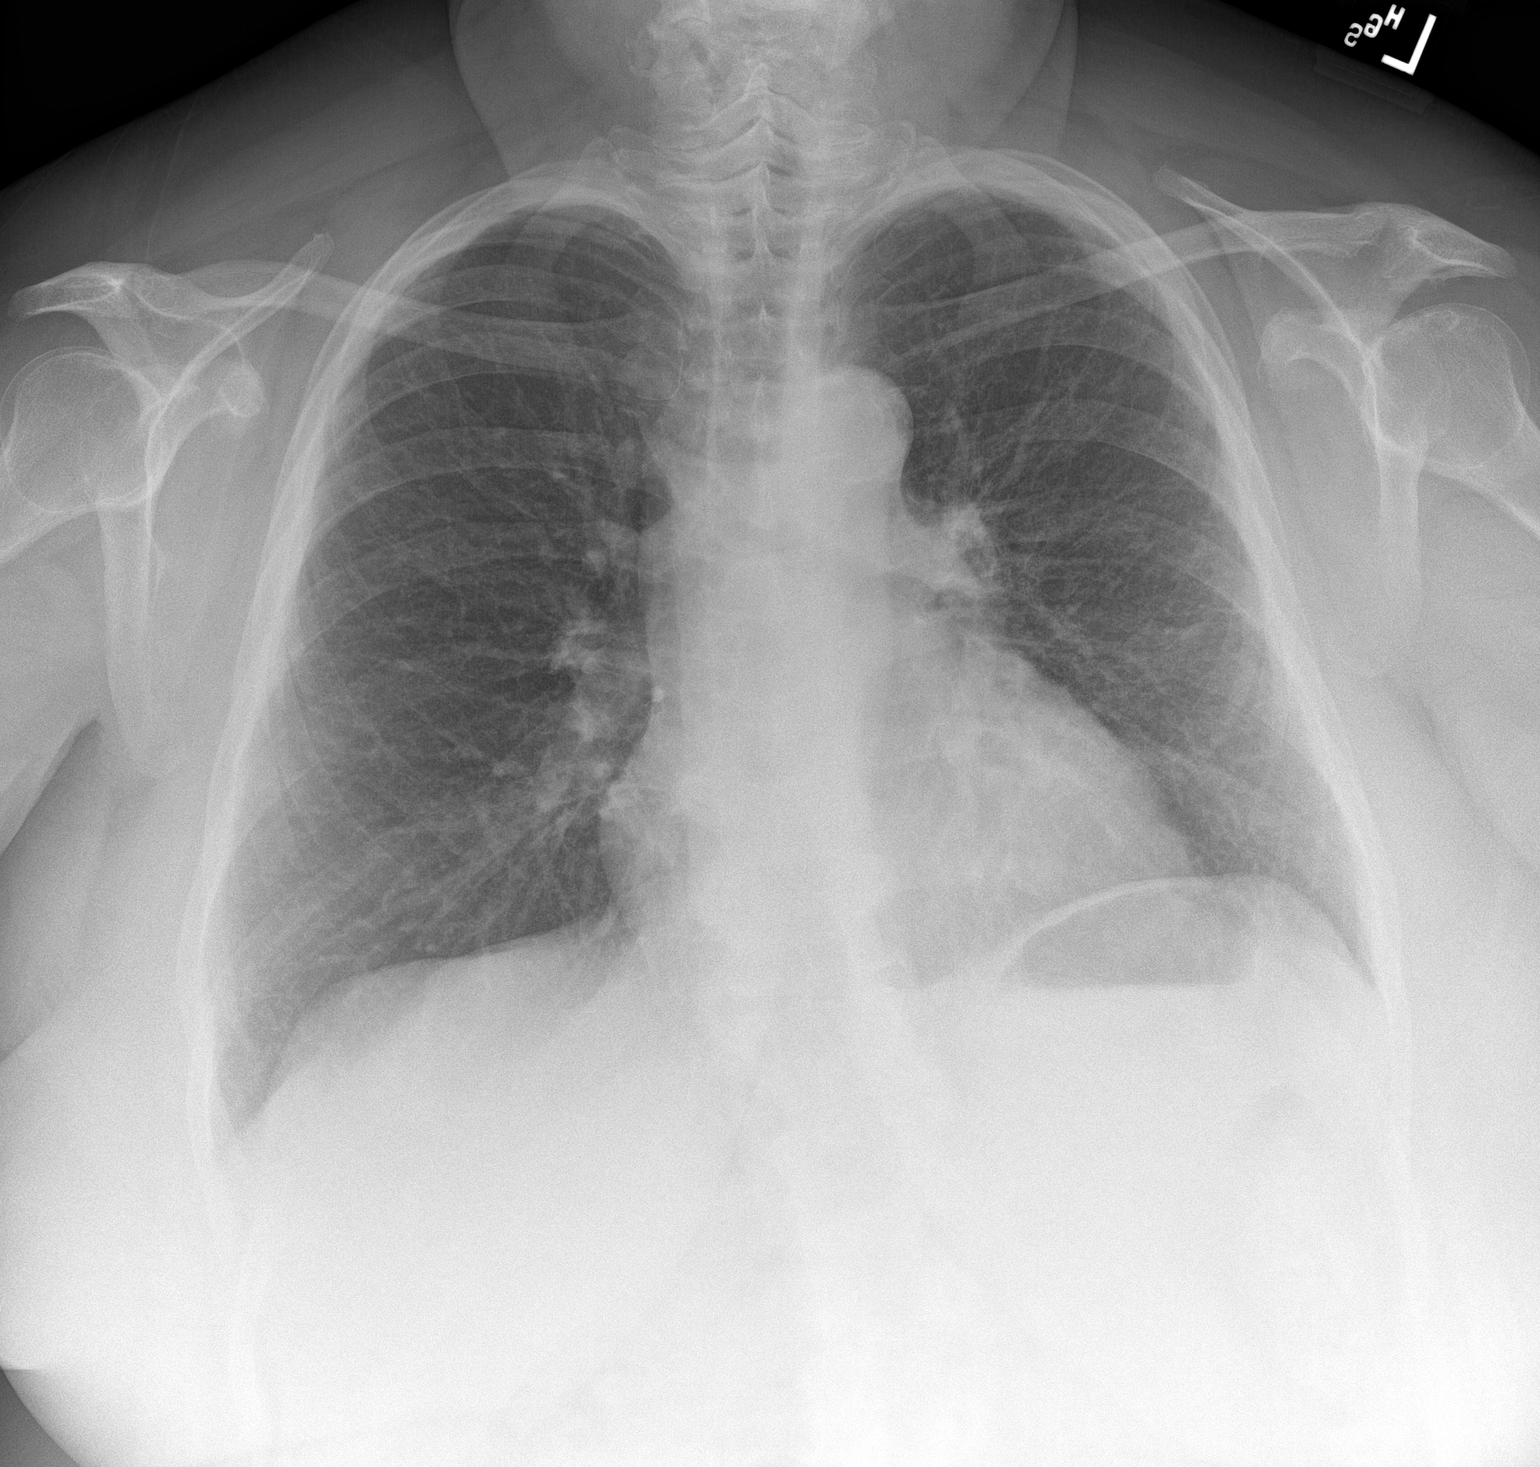
[im 2/2]
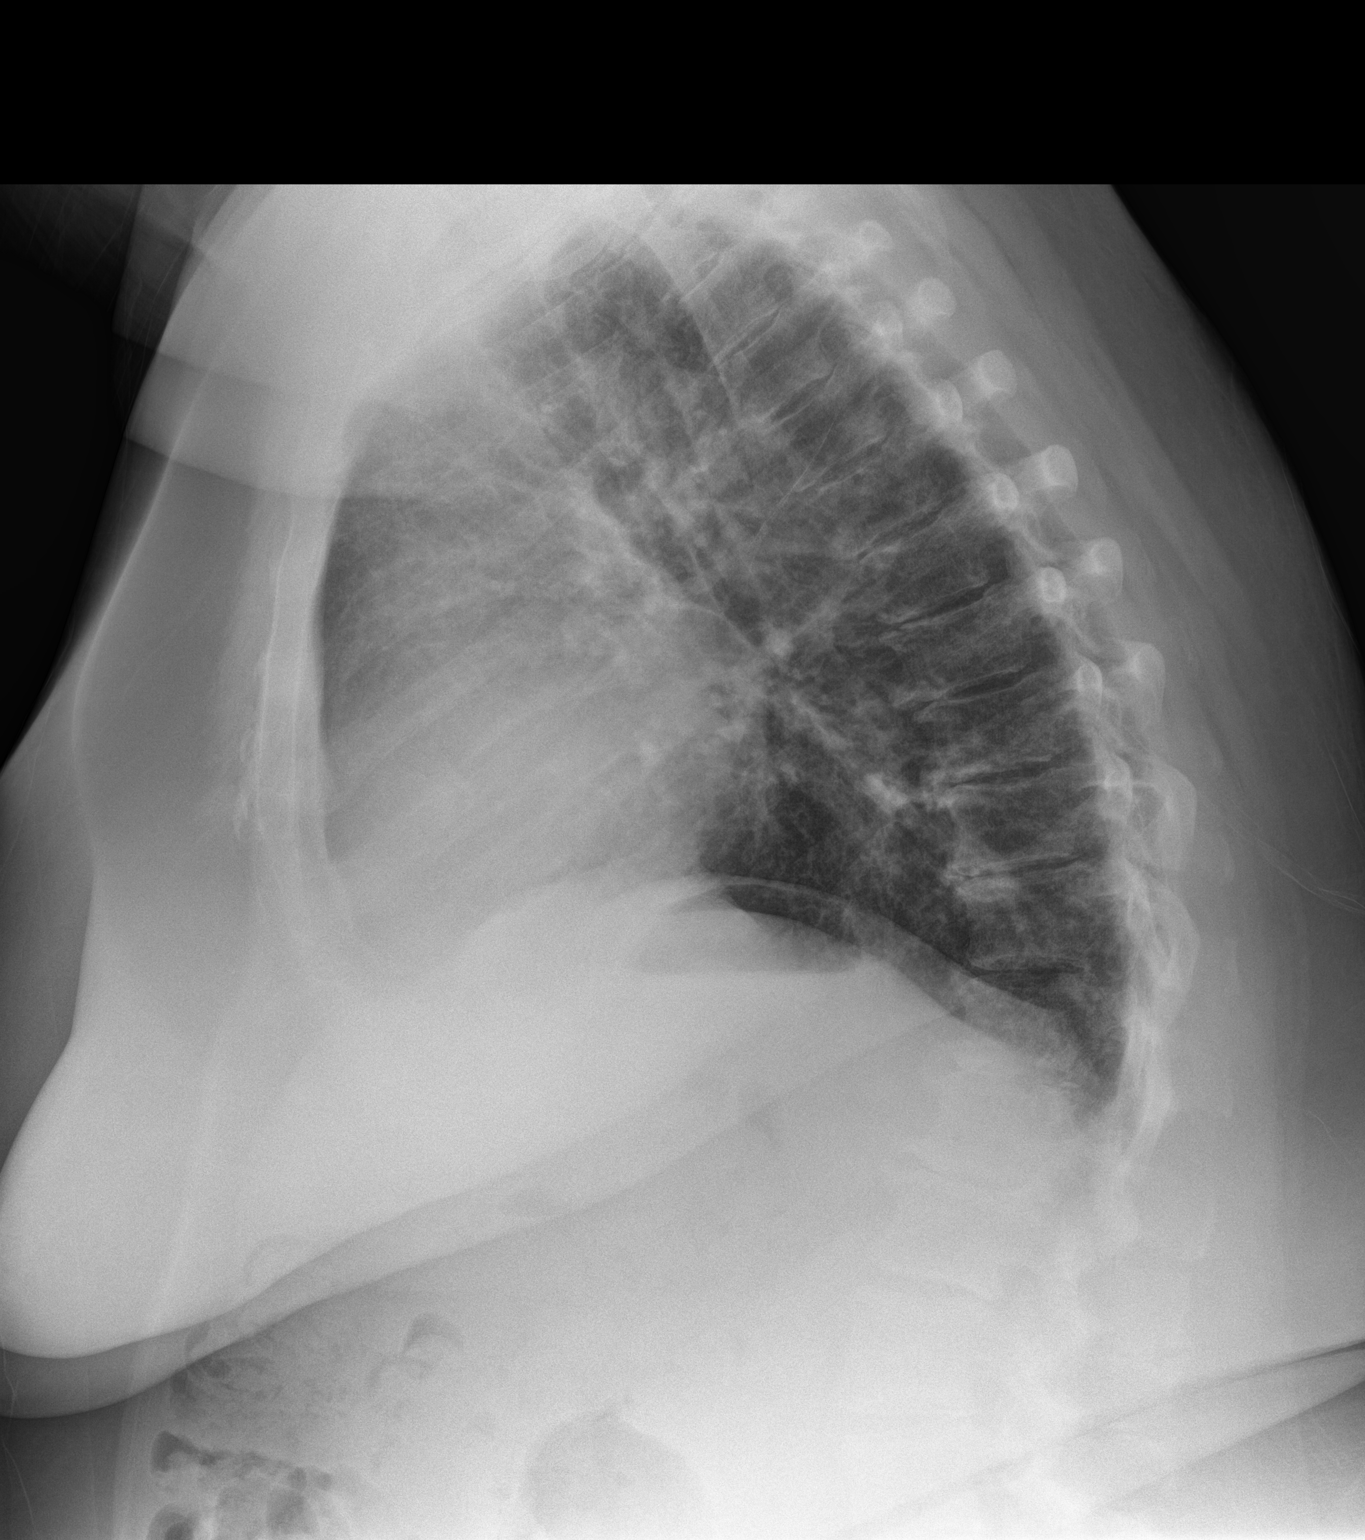

[2 of 2 positions shown; findings below may reference images not displayed]

FINDINGS: Cardiac silhouette upper normal in size, unchanged. Thoracic aorta
mildly atherosclerotic, unchanged. Hilar and mediastinal contours
otherwise unremarkable. Prominent bronchovascular markings diffusely
in moderate central peribronchial thickening, unchanged from the
prior examination. Lungs otherwise clear. No localized airspace
consolidation. No pleural effusions. No pneumothorax. Normal
pulmonary vascularity. Thoracic dextroscoliosis with thoracolumbar
levoscoliosis, degenerative changes throughout the thoracic spine.
IMPRESSION: Stable moderate changes of chronic bronchitis and/or asthma. No
acute cardiopulmonary disease.

## 2018-03-12 ENCOUNTER — Ambulatory Visit (INDEPENDENT_AMBULATORY_CARE_PROVIDER_SITE_OTHER): Payer: Medicare Other

## 2018-03-12 DIAGNOSIS — G4733 Obstructive sleep apnea (adult) (pediatric): Secondary | ICD-10-CM | POA: Diagnosis not present

## 2018-03-12 NOTE — Progress Notes (Signed)
95 percentile pressure 10   95th percentile leak 13.4   apnea index 6.3 /hr  apnea-hypopnea index  7.3 /hr   total days used  >4 hr 76 days  total days used <4 hr 2 days  Total compliance 84 percent  Debbie Cruz is doing great. I have noticed her AHI is steadily going up at each visit. Will follow up on this again in 6 months.

## 2018-03-13 ENCOUNTER — Ambulatory Visit: Payer: Medicare Other

## 2018-03-13 DIAGNOSIS — R2689 Other abnormalities of gait and mobility: Secondary | ICD-10-CM

## 2018-03-13 DIAGNOSIS — M5441 Lumbago with sciatica, right side: Secondary | ICD-10-CM

## 2018-03-13 DIAGNOSIS — R293 Abnormal posture: Secondary | ICD-10-CM

## 2018-03-13 DIAGNOSIS — M5442 Lumbago with sciatica, left side: Secondary | ICD-10-CM

## 2018-03-13 DIAGNOSIS — G8929 Other chronic pain: Secondary | ICD-10-CM

## 2018-03-13 NOTE — Therapy (Signed)
[place] MAIN [facility] SERVICES [address] [place], [place], [address] Phone: [phone]   Fax:  [phone]  Physical Therapy Treatment  Patient Details  Name: Debbie Cruz MRN: [id number] Date of Birth: [birth year] Referring Provider: Hortencia Pilar   Encounter Date: 03/13/2018  PT End of Session - 03/14/18 2132    Visit Number  2    Number of Visits  10    Date for PT Re-Evaluation  05/15/18    Authorization Type  Medicare    Authorization - Visit Number  2    Authorization - Number of Visits  10    PT Start Time  1430    PT Stop Time  6767    PT Time Calculation (min)  60 min    Activity Tolerance  Patient tolerated treatment well;Patient limited by lethargy;Patient limited by pain;Patient limited by fatigue    Behavior During Therapy  [facility] for tasks assessed/performed       Past Medical History:  Diagnosis Date  . Acid reflux   . Anxiety   . Arthritis   . Cancer ([place])    skin  . Depression   . Diabetes mellitus without complication ([place])   . Heart disease   . Heart murmur   . HLD (hyperlipidemia)   . 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 Migraines   . Mixed incontinence   . Obstructive apnea 02/02/2015  . Schizo-affective schizophrenia ([place])   . Thyroid disease     Past Surgical History:  Procedure Laterality Date  . BREAST CYST ASPIRATION Left   . CARPAL TUNNEL RELEASE  2003/2006  . [place]  . THYROIDECTOMY  2000  . Ulna nerve surgery Bilateral 2003/2006    There were no vitals filed for this visit.                   [place] Adult PT Treatment/Exercise - 03/14/18 0001      Transfers   Transfer Cueing  educated on how to maintain TA  engagement for sit-to-stand and in standing      Exercises   Exercises  Lumbar      Lumbar Exercises: Stretches   Pelvic Tilt Limitations  educated on posterior pelvic tilt in seated, side-lying and standing    Lumbar Stabilization Level 1   Other (comment)   10x2 in each position   Other Lumbar Stretch Exercise  seated forward fold       Manual Therapy   Manual Therapy  Other (comment)   Trigger point release   Manual therapy comments  Performed TP release to B Lumbar paraspinals and Piriformis             PT Education - 03/14/18 2130    Education Details  Educated on how improving lumbar stability will help decrease pain and weakness in B LE and HEP    Person(s) Educated  Patient    Methods  Explanation;Demonstration;Tactile cues;Verbal cues;Handout    Comprehension  Verbalized understanding;Returned demonstration;Verbal cues required;Tactile cues required;Need further instruction       PT Short Term Goals - 03/07/18 2136      PT SHORT TERM GOAL #1   Title  Pt. will be able to stand with improved posture and unsupported for 1 min without increase in pain causing her to sit down    Baseline  30 second tolerance to standing, limited by pain    Time  3    Period  Weeks  Status  New    Target Date  03/28/18      PT SHORT TERM GOAL #2   Title  Pt. will report improved confidence and decreased fear of falling with transfer to chairs to watch movies and sing at day program.    Baseline  Is fearful of falling with short walk to chair at day program.    Time  5    Period  Weeks    Status  New    Target Date  04/11/18      PT SHORT TERM GOAL #3   Title  Pt. will demonstrate ability to perform 5xSTS in 15 seconds to demonstrate improved mobility and clinically meaningful decreased falls risk    Baseline  18  seconds.    Time  5    Period  Weeks    Status  New    Target Date  04/11/18        PT Long Term Goals - 03/07/18 2145      PT LONG TERM GOAL #1   Title  Pt. will demonstrate ability to perform 5xSTS in 12 seconds to demonstrate improved mobility and reduction of falls risk to within age-related norms.    Baseline  18 seconds    Time  10    Period  Weeks    Status  New    Target Date  05/16/18       PT LONG TERM GOAL #2   Title  Patient will demonstrate functional recruitment of TA with breathing, sit-to-stand, squatting/lifting, and walking to allow for improved pelvic brace coordination, improved balance, and decreased pressure on nerves to allow for improved balance and decreased pain.    Baseline  Pt. demonstrates anterior pelvic tilt with sitting, standing and walking and no TA activation to protect lumbar spine.    Time  10    Period  Weeks    Status  New    Target Date  05/16/18      PT LONG TERM GOAL #3   Title  Pt. will be able to walk for 30 min. to improve endurance, overall health, and to improve her ability to participate in meaningful activity at her home.    Baseline  is limited to ~ 15 min. of walking due to pain and fatigue.    Time  10    Period  Weeks    Status  New    Target Date  05/16/18            Plan - 03/14/18 2132    Clinical Impression Statement  Pt. responded with hesitancy and mild distrust of diagnosis at first but was able to notice a difference in her ability to stand without paresthesia following treatment and this helped her buy-in. She demonstrated understanding of all education provided but will benefit from review.    Clinical Presentation  Evolving    Clinical Decision Making  Moderate    Rehab Potential  Fair    Clinical Impairments Affecting Rehab Potential  Schizophrenia, spondylolisthesis, obesity, poor motivation and history of poor compliance    PT Frequency  2x / week    PT Duration  --   10 weeks   PT Treatment/Interventions  ADLs/Self Care Home Management;Aquatic Therapy;Electrical Stimulation;Traction;Gait training;Stair training;Functional mobility training;Therapeutic activities;Therapeutic exercise;Balance training;Patient/family education;Neuromuscular re-education;Manual techniques;Dry needling;Taping    PT Next Visit Plan  ask about Urge suppression technique, educate on pelvic tilt/TA activation in seated, supine,   and standing. palpate lumbar extensors and hip-flexors and  discuss DN     PT Home Exercise Plan  urge-suppression technique    Recommended Other Services  aquatic therapy    Consulted and Agree with Plan of Care  Patient       Patient will benefit from skilled therapeutic intervention in order to improve the following deficits and impairments:  Abnormal gait, Increased fascial restricitons, Impaired sensation, Improper body mechanics, Pain, Cardiopulmonary status limiting activity, Decreased coordination, Decreased mobility, Hypermobility, Increased muscle spasms, Postural dysfunction, Decreased activity tolerance, Decreased endurance, Decreased range of motion, Decreased strength, Impaired perceived functional ability, Decreased balance, Difficulty walking, Impaired flexibility, Obesity  Visit Diagnosis: Balance problem  Chronic midline low back pain with bilateral sciatica  Abnormal posture  Other abnormalities of gait and mobility     Problem List Patient Active Problem List   Diagnosis Date Noted  . Chronic tension-type headache, not intractable 05/22/2016  . MCI (mild cognitive impairment) 05/22/2016  . Primary osteoarthritis of left knee 01/16/2016  . Breathlessness on exertion 07/28/2015  . Arthritis sicca 06/21/2015  . Morbid (severe) obesity due to excess calories ([place]) 06/21/2015  . Morbid obesity with BMI of 45.0-49.9, adult ([place]) 06/21/2015  . Schizoaffective disorder ([place]) 06/08/2015  . Absence of bladder continence 05/29/2015  . Cephalalgia 05/18/2015  . Chest pain 03/14/2015  . Difficulty in walking 02/02/2015  . Obstructive apnea 02/02/2015  . Excessive falling 02/01/2015  . Shuffling gait 02/01/2015  . Amnesia 12/20/2014  . Polyneuropathy 12/20/2014  . Poor concentration 12/20/2014  . Incomplete bladder emptying 09/14/2013  . Mixed incontinence 09/14/2013  . 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 Acquired hypothyroidism 12/08/2012  . Anxiety 12/08/2012   . Clinical depression 12/08/2012  . Acid reflux 12/08/2012  . Blood glucose elevated 12/08/2012  . Incontinence 12/08/2012  . Pure hypercholesterolemia 12/08/2012  . Apnea, sleep 12/08/2012  . Recurrent major depressive disorder, in full remission ([place]) 12/08/2012   Willa Rough DPT, ATC Willa Rough 03/14/2018, 9:36 PM  [place] MAIN [facility] SERVICES [address] [place], [place], [address] Phone: [phone]   Fax:  [phone]  Name: MAIKO SALAIS MRN: [id number] Date of Birth: 07-Nov-1945

## 2018-03-13 NOTE — Patient Instructions (Signed)
Pelvic Tilt With Pelvic Floor (Hook-Lying)        Lie with hips and knees bent. Squeeze pelvic floor and flatten low back while breathing out so that pelvis tilts. Repeat _3x10__ times. Do _1-2__ times a day.  Put a pillow under your hips in this position when doing this exercise to help decrease pressure in the pelvis when it feels "heavy".    Do 3 sets of 10 tilts per day. Breathe in when you tilt forward (A) and out when you tuck under (B).

## 2018-03-27 ENCOUNTER — Ambulatory Visit: Payer: Medicare Other

## 2018-03-27 DIAGNOSIS — R293 Abnormal posture: Secondary | ICD-10-CM

## 2018-03-27 DIAGNOSIS — G8929 Other chronic pain: Secondary | ICD-10-CM

## 2018-03-27 DIAGNOSIS — M5441 Lumbago with sciatica, right side: Secondary | ICD-10-CM

## 2018-03-27 DIAGNOSIS — M5442 Lumbago with sciatica, left side: Secondary | ICD-10-CM

## 2018-03-27 DIAGNOSIS — R2689 Other abnormalities of gait and mobility: Secondary | ICD-10-CM

## 2018-03-29 NOTE — Therapy (Signed)
[facility] SERVICES [address] [place], [place], [address] Phone: [phone]   Fax:  [phone]  March 29, 2018   '@[email or web address]'$ @  Physical Therapy Discharge Summary  Patient: TYKERIA WAWRZYNIAK  MRN: [id number]  Date of Birth: 02/17/1946   Diagnosis: Balance problem  Chronic midline low back pain with bilateral sciatica  Abnormal posture  Other abnormalities of gait and mobility Referring Provider: Hortencia Pilar   The above patient had been seen in Physical Therapy 2 times of 20 treatments scheduled with 0 no shows and 0 cancellations.  The treatment consisted of Therapeutic exercise and self-care education The patient is: minimally improved when Pt. complies with reccomendations  Subjective: Pt. Tried doing exercises but had a hard time on her soft bed and then did seated ones a couple times when she remembered but then quit. She notices less weakness and pain in her upper legs when she uses strategy taught but still has tingling in her feet and hands. She can use urge-suppression successfully to get to the bathroom but gets there sometimes to find that someone is in there and she has leakage while waiting due to frustration. She does not have motivation to do the exercises given because she wants help with her L knee and Shoulder not her balance and LE weakness per the referral.  Discharge Findings: Pt. Has made minimal improvement toward goal when she tries to apply what has been taught but she has little motivation due to her desiring help with L shoulder and knee rather than her balance.  Functional Status at Discharge: unchanged  No Goals Met    Sincerely,  Willa Rough DPT, ATC Willa Rough, PT   CC '@[email or web address]'$ @  [place] [address] [place], [place], [address] Phone: [phone]   Fax:  [phone]  Patient: ALIANIS TRIMMER  MRN:  [id number]  Date of Birth: [birth year]

## 2018-05-08 ENCOUNTER — Telehealth: Payer: Self-pay

## 2018-05-08 ENCOUNTER — Ambulatory Visit (INDEPENDENT_AMBULATORY_CARE_PROVIDER_SITE_OTHER): Payer: Medicare Other | Admitting: Internal Medicine

## 2018-05-08 ENCOUNTER — Encounter: Payer: Self-pay | Admitting: Internal Medicine

## 2018-05-08 VITALS — BP 120/78 | HR 66 | Resp 16 | Ht 59.0 in | Wt 218.0 lb

## 2018-05-08 DIAGNOSIS — G4733 Obstructive sleep apnea (adult) (pediatric): Secondary | ICD-10-CM

## 2018-05-08 DIAGNOSIS — R0602 Shortness of breath: Secondary | ICD-10-CM

## 2018-05-08 DIAGNOSIS — Z9989 Dependence on other enabling machines and devices: Secondary | ICD-10-CM

## 2018-05-08 DIAGNOSIS — J449 Chronic obstructive pulmonary disease, unspecified: Secondary | ICD-10-CM | POA: Diagnosis not present

## 2018-05-08 DIAGNOSIS — K219 Gastro-esophageal reflux disease without esophagitis: Secondary | ICD-10-CM

## 2018-05-08 NOTE — Telephone Encounter (Signed)
Gave [facility] rx order and put copy in scan.Debbie Cruz

## 2018-05-08 NOTE — Patient Instructions (Signed)

## 2018-05-08 NOTE — Progress Notes (Signed)
[facility] [place], [place] [address]  Pulmonary Sleep Medicine   Office Visit Note  Patient Name: Debbie Cruz DOB: [birth year] MRN [id number]  Date of Service: 05/08/2018  Complaints/HPI: Pt here for follow up on OSA, and chronic bronchitis, and GERD. She reports she has been wearing her CPAP every night. She is cleaning it with vinegar and water. She reports she did not go and get her Upper GI done, because she didn't feel like she needed it.  She denies overt SOB.  No recent hospitalizations. No chest pain, sob or palpitations.      ROS  General: (-) fever, (-) chills, (-) night sweats, (-) weakness Skin: (-) rashes, (-) itching,. Eyes: (-) visual changes, (-) redness, (-) itching. Nose and Sinuses: (-) nasal stuffiness or itchiness, (-) postnasal drip, (-) nosebleeds, (-) sinus trouble. Mouth and Throat: (-) sore throat, (-) hoarseness. Neck: (-) swollen glands, (-) enlarged thyroid, (-) neck pain. Respiratory: - cough, (-) bloody sputum, - shortness of breath, - wheezing. Cardiovascular: - ankle swelling, (-) chest pain. Lymphatic: (-) lymph node enlargement. Neurologic: (-) numbness, (-) tingling. Psychiatric: (-) anxiety, (-) depression   Current Medication: Outpatient Encounter Medications as of 05/08/2018  Medication Sig Note  . acetaminophen (TYLENOL) 500 MG tablet Take 325 mg by mouth every 6 (six) hours as needed. Reported on 08/17/2015   . albuterol (PROVENTIL HFA;VENTOLIN HFA) 108 (90 Base) MCG/ACT inhaler Inhale 2 puffs into the lungs every 6 (six) hours as needed for wheezing or shortness of breath.   Marland Kitchen buPROPion (WELLBUTRIN XL) 300 MG 24 hr tablet  12/28/2015: Received from: External Pharmacy  . Calcium Carbonate-Vitamin D (CALCIUM-VITAMIN D) 500-200 MG-UNIT per tablet Take 1 tablet by mouth daily.   . fluticasone (FLONASE) 50 MCG/ACT nasal spray Place into both nostrils daily.   Marland Kitchen levothyroxine (SYNTHROID, LEVOTHROID) 137 MCG  tablet  06/08/2015: Received from: External Pharmacy  . loratadine (CLARITIN) 10 MG tablet Take 10 mg by mouth. 05/24/2015: Received from: [facility]  . meloxicam (MOBIC) 7.5 MG tablet Take 7.5 mg by mouth daily.   . polyethylene glycol (MIRALAX / GLYCOLAX) packet Take by mouth. 05/24/2015: Received from: [facility]  . pravastatin (PRAVACHOL) 40 MG tablet Take 40 mg by mouth. 05/24/2015: Received from: [facility]  . risperiDONE (RISPERDAL) 3 MG tablet  06/08/2015: Received from: External Pharmacy  . sertraline (ZOLOFT) 100 MG tablet Take 100 mg by mouth daily.   . sodium chloride (OCEAN) 0.65 % nasal spray Place into the nose. 05/24/2015: Received from: [facility]  . topiramate (TOPAMAX) 25 MG tablet Take 1 capsule at night for 1 week, then increase to 2 capsules at night and continue 05/24/2015: Received from: [facility]  . pantoprazole (PROTONIX) 40 MG tablet TAKE ONE TABLET BY MOUTH EACH DAY. *DO NOT CRUSH* 05/24/2015: Received from: [facility]  . [DISCONTINUED] azithromycin (ZITHROMAX) 250 MG tablet As directed z pack (Patient not taking: Reported on 02/25/2018)   . [DISCONTINUED] donepezil (ARICEPT) 5 MG tablet Reported on 12/28/2015 05/24/2015: Received from: [facility]  . [DISCONTINUED] gabapentin (NEURONTIN) 300 MG capsule Take 300 mg by mouth. Reported on 12/28/2015 05/24/2015: Received from: [facility]  . [DISCONTINUED] levofloxacin (LEVAQUIN) 500 MG tablet Take 1 tablet (500 mg total) by mouth daily. (Patient not taking: Reported on 02/25/2018) 03/06/2018: Pt. Does not know what this medication is for or if she is currently supposed to be taking it. There is  conflicting information between her printed medication list and her Epic EMR  . [DISCONTINUED] menthol-cetylpyridinium (CEPACOL) 3 MG lozenge Take 1 lozenge by mouth as needed for sore throat.   . [DISCONTINUED] MYRBETRIQ 25 MG TB24 tablet  10/19/2015: Received  from: External Pharmacy  . [DISCONTINUED] traMADol (ULTRAM) 50 MG tablet Take 50 mg by mouth every 6 (six) hours as needed. Reported on 11/23/2015    No facility-administered encounter medications on file as of 05/08/2018.     Surgical History: Past Surgical History:  Procedure Laterality Date  . BREAST CYST ASPIRATION Left   . CARPAL TUNNEL RELEASE  2003/2006  . [place]  . THYROIDECTOMY  2000  . Ulna nerve surgery Bilateral 2003/2006    Medical History: Past Medical History:  Diagnosis Date  . Acid reflux   . Anxiety   . Arthritis   . Cancer ([place])    skin  . Depression   . Diabetes mellitus without complication ([place])   . Heart disease   . Heart murmur   . HLD (hyperlipidemia)   . 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 Migraines   . Mixed incontinence   . Obstructive apnea 02/02/2015  . Schizo-affective schizophrenia ([place])   . Thyroid disease     Family History: Family History  Problem Relation Age of Onset  . Breast cancer Maternal Grandmother   . Breast cancer Maternal Grandfather   . Hematuria Father   . Kidney disease Neg Hx     Social History: Social History   Socioeconomic History  . Marital status: Divorced    Spouse name: Not on file  . Number of children: Not on file  . Years of education: Not on file  . Highest education level: Not on file  Occupational History  . Not on file  Social Needs  . Financial resource strain: Not on file  . Food insecurity:    Worry: Not on file    Inability: Not on file  . Transportation needs:    Medical: Not on file    Non-medical: Not on file  Tobacco Use  . Smoking status: Never Smoker  . Smokeless tobacco: Never Used  Substance and Sexual Activity  . Alcohol use: No    Alcohol/week: 0.0 standard drinks  . Drug use: No  . Sexual activity: Not on file  Lifestyle  . Physical activity:    Days per week: Not on file    Minutes per session: Not on file  . Stress: Not on file  Relationships   . Social connections:    Talks on phone: Not on file    Gets together: Not on file    Attends religious service: Not on file    Active member of club or organization: Not on file    Attends meetings of clubs or organizations: Not on file    Relationship status: Not on file  . Intimate partner violence:    Fear of current or ex partner: Not on file    Emotionally abused: Not on file    Physically abused: Not on file    Forced sexual activity: Not on file  Other Topics Concern  . Not on file  Social History Narrative  . Not on file    Vital Signs: Blood pressure 120/78, pulse 66, resp. rate 16, height 4\' 11"  (1.499 m), weight 218 lb (98.9 kg), SpO2 96 %.  Examination: General Appearance: The patient is well-developed, well-nourished, and in no distress. Skin: Gross inspection of skin unremarkable.  Head: normocephalic, no gross deformities. Eyes: no gross deformities noted. ENT: ears appear grossly normal no exudates. Neck: Supple. No thyromegaly. No LAD. Respiratory: Clear to auscultation bilaterly. Cardiovascular: Normal S1 and S2 without murmur or rub. Extremities: No cyanosis. pulses are equal. Neurologic: Alert and oriented. No involuntary movements.  LABS: No results found for this or any previous visit (from the past 2160 hour(s)).  Radiology: Mm Digital Screening Bilateral  Result Date: 10/08/2017 CLINICAL DATA:  Screening. EXAM: DIGITAL SCREENING BILATERAL MAMMOGRAM WITH CAD COMPARISON:  Previous exam(s). ACR Breast Density Category b: There are scattered areas of fibroglandular density. FINDINGS: There are no findings suspicious for malignancy. Images were processed with CAD. IMPRESSION: No mammographic evidence of malignancy. A result letter of this screening mammogram will be mailed directly to the patient. RECOMMENDATION: Screening mammogram in one year. (Code:SM-B-01Y) BI-RADS CATEGORY  1: Negative. Electronically Signed   By: Lajean Manes M.D.   On: 10/08/2017  12:13    No results found.  No results found.    Assessment and Plan: Patient Active Problem List   Diagnosis Date Noted  . Chronic tension-type headache, not intractable 05/22/2016  . MCI (mild cognitive impairment) 05/22/2016  . Primary osteoarthritis of left knee 01/16/2016  . Breathlessness on exertion 07/28/2015  . Arthritis sicca 06/21/2015  . Morbid (severe) obesity due to excess calories ([place]) 06/21/2015  . Morbid obesity with BMI of 45.0-49.9, adult ([place]) 06/21/2015  . Schizoaffective disorder ([place]) 06/08/2015  . Absence of bladder continence 05/29/2015  . Cephalalgia 05/18/2015  . Chest pain 03/14/2015  . Difficulty in walking 02/02/2015  . Obstructive apnea 02/02/2015  . Excessive falling 02/01/2015  . Shuffling gait 02/01/2015  . Amnesia 12/20/2014  . Polyneuropathy 12/20/2014  . Poor concentration 12/20/2014  . Incomplete bladder emptying 09/14/2013  . Mixed incontinence 09/14/2013  . 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 Acquired hypothyroidism 12/08/2012  . Anxiety 12/08/2012  . Clinical depression 12/08/2012  . Acid reflux 12/08/2012  . Blood glucose elevated 12/08/2012  . Incontinence 12/08/2012  . Pure hypercholesterolemia 12/08/2012  . Apnea, sleep 12/08/2012  . Recurrent major depressive disorder, in full remission ([place]) 12/08/2012    1. OSA on CPAP She has moderate sleep apnea and should continue to use CPAP as directed.    2. Chronic Obstructive Bronchitis Pt denies breathing difficulty, she is a poor historian.  She is unsure if she gets short of breath when walking at a normal pace.  She uses a [employer] (consulting), and reports she notices some SOB when "walking fast".    3. Gastroesophageal reflux disease without esophagitis PT continues to take Protonix as directed.  Denies symptoms.   4. Morbid obesity ([place]) Obesity Counseling: Risk Assessment: An assessment of behavioral risk factors was made today and includes lack of exercise sedentary  lifestyle, lack of portion control and poor dietary habits.  Risk Modification Advice: She was counseled on portion control guidelines. Restricting daily caloric intake to. . The detrimental long term effects of obesity on her health and ongoing poor compliance was also discussed with the patient.  5. SOB (shortness of breath) Pt is 100 % of predicted value FEV1/FVC - Spirometry with Graph  General Counseling: I have discussed the findings of the evaluation and examination with Debbie Cruz.  I have also discussed any further diagnostic evaluation thatmay be needed or ordered today. Debbie Cruz verbalizes understanding of the findings of todays visit. We also reviewed her medications today and discussed drug interactions and side effects including but not limited excessive  drowsiness and altered mental states. We also discussed that there is always a risk not just to her but also people around her. she has been encouraged to call the office with any questions or concerns that should arise related to todays visit.    Time spent: 25 This patient was seen by Orson Gear AGNP-C in Collaboration with Dr. Devona Konig as a part of collaborative care agreement.   I have personally obtained a history, examined the patient, evaluated laboratory and imaging results, formulated the assessment and plan and placed orders.    Allyne Gee, MD [facility] Pulmonary and Critical Care Sleep medicine

## 2018-05-28 ENCOUNTER — Encounter: Payer: Self-pay | Admitting: Adult Health

## 2018-05-28 ENCOUNTER — Ambulatory Visit (INDEPENDENT_AMBULATORY_CARE_PROVIDER_SITE_OTHER): Payer: Medicare Other

## 2018-05-28 ENCOUNTER — Ambulatory Visit (INDEPENDENT_AMBULATORY_CARE_PROVIDER_SITE_OTHER): Payer: Medicare Other | Admitting: Adult Health

## 2018-05-28 VITALS — BP 132/80 | HR 76 | Temp 98.0°F | Resp 16 | Ht 59.0 in | Wt 211.0 lb

## 2018-05-28 DIAGNOSIS — G4733 Obstructive sleep apnea (adult) (pediatric): Secondary | ICD-10-CM | POA: Diagnosis not present

## 2018-05-28 DIAGNOSIS — Z9989 Dependence on other enabling machines and devices: Secondary | ICD-10-CM

## 2018-05-28 DIAGNOSIS — R05 Cough: Secondary | ICD-10-CM

## 2018-05-28 DIAGNOSIS — J069 Acute upper respiratory infection, unspecified: Secondary | ICD-10-CM

## 2018-05-28 DIAGNOSIS — J0111 Acute recurrent frontal sinusitis: Secondary | ICD-10-CM | POA: Diagnosis not present

## 2018-05-28 DIAGNOSIS — R059 Cough, unspecified: Secondary | ICD-10-CM

## 2018-05-28 MED ORDER — METHYLPREDNISOLONE ACETATE 80 MG/ML IJ SUSP
80.0000 mg | Freq: Once | INTRAMUSCULAR | Status: AC
  Administered 2018-05-28: 40 mg via INTRAMUSCULAR

## 2018-05-28 MED ORDER — LEVOFLOXACIN 500 MG PO TABS: 500.0000 mg | ORAL_TABLET | Freq: Every day | ORAL | 0 refills | Status: AC

## 2018-05-28 NOTE — Progress Notes (Signed)
Debbie Cruz was seen today to be fit to new mask. She is also not feeling well having some wheezing and chest congested. I had her added to adam schedule to be seen for sick visit

## 2018-05-28 NOTE — Progress Notes (Signed)
[facility] [place], [place] [address]  Internal MEDICINE  Office Visit Note  Patient Name: Debbie Cruz  [id number]  [id number]  Date of Service: 05/28/2018  Chief Complaint  Patient presents with  . Wheezing     HPI Pt is here for a sick visit, she reports multiple weeks of coughing, wheezing, and sinus pressure and drainage.  She denies any fever and is mostly concerned that this coughing may be an early sign of pneumonia.  She reports that at the group home she lives in the staff is reluctant to let her use her as needed albuterol inhaler.  She states they will generally let her use the inhaler once a day, and if she asked for it more than that they do not want her to use it.   Current Medication:  Outpatient Encounter Medications as of 05/28/2018  Medication Sig Note  . acetaminophen (TYLENOL) 500 MG tablet Take 325 mg by mouth every 6 (six) hours as needed. Reported on 08/17/2015   . albuterol (PROVENTIL HFA;VENTOLIN HFA) 108 (90 Base) MCG/ACT inhaler Inhale 2 puffs into the lungs every 6 (six) hours as needed for wheezing or shortness of breath.   Marland Kitchen buPROPion (WELLBUTRIN XL) 300 MG 24 hr tablet  12/28/2015: Received from: External Pharmacy  . Calcium Carbonate-Vitamin D (CALCIUM-VITAMIN D) 500-200 MG-UNIT per tablet Take 1 tablet by mouth daily.   . fluticasone (FLONASE) 50 MCG/ACT nasal spray Place into both nostrils daily.   Marland Kitchen levothyroxine (SYNTHROID, LEVOTHROID) 137 MCG tablet  06/08/2015: Received from: External Pharmacy  . loratadine (CLARITIN) 10 MG tablet Take 10 mg by mouth. 05/24/2015: Received from: [facility]  . meloxicam (MOBIC) 7.5 MG tablet Take 7.5 mg by mouth daily.   . polyethylene glycol (MIRALAX / GLYCOLAX) packet Take by mouth. 05/24/2015: Received from: [facility]  . pravastatin (PRAVACHOL) 40 MG tablet Take 40 mg by mouth. 05/24/2015: Received from: [facility]  . risperiDONE (RISPERDAL) 3 MG tablet   06/08/2015: Received from: External Pharmacy  . sertraline (ZOLOFT) 100 MG tablet Take 100 mg by mouth daily.   . sodium chloride (OCEAN) 0.65 % nasal spray Place into the nose. 05/24/2015: Received from: [facility]  . topiramate (TOPAMAX) 25 MG tablet Take 1 capsule at night for 1 week, then increase to 2 capsules at night and continue 05/24/2015: Received from: [facility]  . levofloxacin (LEVAQUIN) 500 MG tablet Take 1 tablet (500 mg total) by mouth daily.   . pantoprazole (PROTONIX) 40 MG tablet TAKE ONE TABLET BY MOUTH EACH DAY. *DO NOT CRUSH* 05/24/2015: Received from: [facility]  . [EXPIRED] methylPREDNISolone acetate (DEPO-MEDROL) injection 80 mg     No facility-administered encounter medications on file as of 05/28/2018.       Medical History: Past Medical History:  Diagnosis Date  . Acid reflux   . Anxiety   . Arthritis   . Cancer ([place])    skin  . Depression   . Diabetes mellitus without complication ([place])   . Heart disease   . Heart murmur   . HLD (hyperlipidemia)   . 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 Migraines   . Mixed incontinence   . Obstructive apnea 02/02/2015  . Schizo-affective schizophrenia ([place])   . Thyroid disease      Vital Signs: BP 132/80 (BP Location: Right Arm, Patient Position: Sitting, Cuff Size: Normal)   Pulse 76   Temp 98 F (36.7 C) (Oral)  Resp 16   Ht 4\' 11"  (1.499 m)   Wt 211 lb (95.7 kg)   SpO2 96%   BMI 42.62 kg/m    Review of Systems  Physical Exam  Constitutional: She is oriented to person, place, and time. She appears well-developed and well-nourished. No distress.  HENT:  Head: Normocephalic and atraumatic.  Nose: Rhinorrhea present. Right sinus exhibits maxillary sinus tenderness and frontal sinus tenderness. Left sinus exhibits maxillary sinus tenderness and frontal sinus tenderness.  Mouth/Throat: Oropharynx is clear and moist. No oropharyngeal exudate.  Eyes: Pupils are  equal, round, and reactive to light. EOM are normal.  Neck: Normal range of motion. Neck supple. No JVD present. No tracheal deviation present. No thyromegaly present.  Cardiovascular: Normal rate, regular rhythm and normal heart sounds. Exam reveals no gallop and no friction rub.  No murmur heard. Pulmonary/Chest: Effort normal. No respiratory distress. She has wheezes. She has no rales. She exhibits no tenderness.  Abdominal: Soft. There is no tenderness. There is no guarding.  Musculoskeletal: Normal range of motion.  Lymphadenopathy:    She has no cervical adenopathy.  Neurological: She is alert and oriented to person, place, and time. No cranial nerve deficit.  Skin: Skin is warm and dry. She is not diaphoretic.  Psychiatric: She has a normal mood and affect. Her behavior is normal. Judgment and thought content normal.  Nursing note and vitals reviewed.  Assessment/Plan: 1. Acute recurrent frontal sinusitis Patient has multiple antibiotic allergies, will give a course of Levaquin. - levofloxacin (LEVAQUIN) 500 MG tablet; Take 1 tablet (500 mg total) by mouth daily.  Dispense: 10 tablet; Refill: 0  2. Upper respiratory tract infection, unspecified type We will treat patient's upper respiratory tract with the Levaquin also, she will likely get benefit from the Depo-Medrol injection 2. - levofloxacin (LEVAQUIN) 500 MG tablet; Take 1 tablet (500 mg total) by mouth daily.  Dispense: 10 tablet; Refill: 0  3. Cough Patient having multiple coughing fits during exam.  The patient option of 6-day prednisone taper or injection of Depo-Medrol.  She is requesting the injection. - methylPREDNISolone acetate (DEPO-MEDROL) injection 80 mg  4. OSA on CPAP Patient encouraged to continue to use CPAP every night, or when she naps during the day.  She denies any complaints at this time.  5. Morbid obesity ([place]) Obesity Counseling: Risk Assessment: An assessment of behavioral risk factors was made  today and includes lack of exercise sedentary lifestyle, lack of portion control and poor dietary habits.  Risk Modification Advice: She was counseled on portion control guidelines. Restricting daily caloric intake to. . The detrimental long term effects of obesity on her health and ongoing poor compliance was also discussed with the patient.    General Counseling: ladeja pelham understanding of the findings of todays visit and agrees with plan of treatment. I have discussed any further diagnostic evaluation that may be needed or ordered today. We also reviewed her medications today. she has been encouraged to call the office with any questions or concerns that should arise related to todays visit.   No orders of the defined types were placed in this encounter.   Meds ordered this encounter  Medications  . levofloxacin (LEVAQUIN) 500 MG tablet    Sig: Take 1 tablet (500 mg total) by mouth daily.    Dispense:  10 tablet    Refill:  0  . methylPREDNISolone acetate (DEPO-MEDROL) injection 80 mg    Time spent: 25 Minutes  This patient was seen by  Orson Gear AGNP-C in Collaboration with Dr Lavera Guise as a part of collaborative care agreement.  Kendell Bane AGNP-C Internal Medicine

## 2018-09-10 ENCOUNTER — Ambulatory Visit: Payer: Self-pay

## 2018-09-24 ENCOUNTER — Other Ambulatory Visit: Payer: Self-pay | Admitting: Family Medicine

## 2018-09-24 ENCOUNTER — Ambulatory Visit: Payer: Self-pay

## 2018-09-24 DIAGNOSIS — Z1231 Encounter for screening mammogram for malignant neoplasm of breast: Secondary | ICD-10-CM

## 2018-10-01 ENCOUNTER — Ambulatory Visit (INDEPENDENT_AMBULATORY_CARE_PROVIDER_SITE_OTHER): Payer: Medicare Other

## 2018-10-01 DIAGNOSIS — G4733 Obstructive sleep apnea (adult) (pediatric): Secondary | ICD-10-CM

## 2018-10-01 NOTE — Progress Notes (Signed)
95 percentile pressure 10   95th percentile leak 22.1   apnea index 7 /hr  apnea-hypopnea index  1 /hr   total days used  >4 hr 7 days  total days used <4 hr  days  Total compliance 23 percent  She had a roommate that she could not wear cpap, room mate has moved out and she has started back on cpap

## 2018-10-29 DEATH — deceased

## 2018-11-13 ENCOUNTER — Ambulatory Visit: Payer: Self-pay | Admitting: Adult Health

## 2019-05-06 ENCOUNTER — Ambulatory Visit: Payer: Self-pay
